# Patient Record
Sex: Female | Born: 1975
Health system: Southern US, Community
[De-identification: ages and names within clinical notes are randomized; demographics above are authoritative.]

## PROBLEM LIST (undated history)

## (undated) ENCOUNTER — Inpatient Hospital Stay: Payer: Self-pay

## (undated) DIAGNOSIS — Z973 Presence of spectacles and contact lenses: Secondary | ICD-10-CM

## (undated) DIAGNOSIS — H9190 Unspecified hearing loss, unspecified ear: Secondary | ICD-10-CM

## (undated) DIAGNOSIS — F419 Anxiety disorder, unspecified: Secondary | ICD-10-CM

## (undated) DIAGNOSIS — K219 Gastro-esophageal reflux disease without esophagitis: Secondary | ICD-10-CM

## (undated) DIAGNOSIS — E079 Disorder of thyroid, unspecified: Secondary | ICD-10-CM

## (undated) HISTORY — DX: Disorder of thyroid, unspecified: E07.9

## (undated) HISTORY — DX: Gastro-esophageal reflux disease without esophagitis: K21.9

## (undated) HISTORY — DX: Anxiety disorder, unspecified: F41.9

---

## 2013-06-18 ENCOUNTER — Emergency Department: Payer: Self-pay | Admitting: Emergency Medicine

## 2013-06-18 LAB — BASIC METABOLIC PANEL
Anion Gap: 9 (ref 7–16)
BUN: 13 mg/dL (ref 7–18)
CHLORIDE: 99 mmol/L (ref 98–107)
CREATININE: 0.72 mg/dL (ref 0.60–1.30)
Calcium, Total: 9.6 mg/dL (ref 8.5–10.1)
Co2: 27 mmol/L (ref 21–32)
EGFR (Non-African Amer.): >60
Glucose: 91 mg/dL (ref 65–99)
Osmolality: 270 (ref 275–301)
Potassium: 3.3 mmol/L — ABNORMAL LOW (ref 3.5–5.1)
Sodium: 135 mmol/L — ABNORMAL LOW (ref 136–145)

## 2013-06-18 LAB — CBC
HCT: 45.6 % (ref 35.0–47.0)
HGB: 15.3 g/dL (ref 12.0–16.0)
MCH: 32.5 pg (ref 26.0–34.0)
MCHC: 33.5 g/dL (ref 32.0–36.0)
MCV: 97 fL (ref 80–100)
PLATELETS: 237 10*3/uL (ref 150–440)
RBC: 4.71 10*6/uL (ref 3.80–5.20)
RDW: 12.6 % (ref 11.5–14.5)
WBC: 9 10*3/uL (ref 3.6–11.0)

## 2013-06-18 LAB — TROPONIN I: Troponin-I: <0.02 ng/mL

## 2013-06-19 LAB — TROPONIN I: Troponin-I: <0.02 ng/mL

## 2015-03-10 ENCOUNTER — Ambulatory Visit (INDEPENDENT_AMBULATORY_CARE_PROVIDER_SITE_OTHER): Payer: BLUE CROSS/BLUE SHIELD | Admitting: Family Medicine

## 2015-03-10 ENCOUNTER — Encounter: Payer: Self-pay | Admitting: Family Medicine

## 2015-03-10 VITALS — BP 116/78 | HR 69 | Temp 97.8°F | Resp 16 | Ht 65.0 in | Wt 169.6 lb

## 2015-03-10 DIAGNOSIS — K219 Gastro-esophageal reflux disease without esophagitis: Secondary | ICD-10-CM | POA: Diagnosis not present

## 2015-03-10 DIAGNOSIS — Z8249 Family history of ischemic heart disease and other diseases of the circulatory system: Secondary | ICD-10-CM | POA: Diagnosis not present

## 2015-03-10 DIAGNOSIS — R5382 Chronic fatigue, unspecified: Secondary | ICD-10-CM

## 2015-03-10 DIAGNOSIS — H9190 Unspecified hearing loss, unspecified ear: Secondary | ICD-10-CM | POA: Insufficient documentation

## 2015-03-10 DIAGNOSIS — Z7689 Persons encountering health services in other specified circumstances: Secondary | ICD-10-CM

## 2015-03-10 DIAGNOSIS — IMO0001 Reserved for inherently not codable concepts without codable children: Secondary | ICD-10-CM | POA: Insufficient documentation

## 2015-03-10 DIAGNOSIS — E041 Nontoxic single thyroid nodule: Secondary | ICD-10-CM

## 2015-03-10 DIAGNOSIS — Z7189 Other specified counseling: Secondary | ICD-10-CM

## 2015-03-10 DIAGNOSIS — L309 Dermatitis, unspecified: Secondary | ICD-10-CM | POA: Diagnosis not present

## 2015-03-10 DIAGNOSIS — H9193 Unspecified hearing loss, bilateral: Secondary | ICD-10-CM

## 2015-03-10 MED ORDER — TRIAMCINOLONE ACETONIDE 0.1 % EX CREA: 1.0000 "application " | TOPICAL_CREAM | Freq: Two times a day (BID) | CUTANEOUS | Status: DC

## 2015-03-10 NOTE — Assessment & Plan Note (Signed)
Hands and chest. Trial of kenalog cream BID to help with symptoms. If not improving- refer to dermatology.

## 2015-03-10 NOTE — Assessment & Plan Note (Signed)
Controlled. Alarm symptoms reviewed. Encouraged avoiding dietary triggers.

## 2015-03-10 NOTE — Patient Instructions (Signed)
We will get an ultrasound of you thyroid to check on the old nodule. We will also check some lab work.  Please get labwork done at [facility] and I will call you with the results.    You will need an appt for a well-woman exam with pap smear anytime this year.    You can try the Kenalog cream twice daily for the eczema (skin rash) on your hands and chest. If not getting better, we will have you see a dermatologist.

## 2015-03-10 NOTE — Assessment & Plan Note (Signed)
Wears hearing aids. Able to read lips. No communication problems. All phone communication via her husband.

## 2015-03-10 NOTE — Progress Notes (Signed)
Subjective:    Patient ID: Vicki Walters, female    DOB: 05-01-1975, 40 y.o.   MRN: [id number]  HPI: Vicki Walters is a 40 y.o. female presenting on 03/10/2015 for Establish Care   HPI  Pt presents to establish care today. Previous care provider was in [place].  It has been 4 years since Her last PCP visit. Records from previous provider will be requested and reviewed. Current medical problems include:  Hearing impaired: Reads lips and uses hearing aids.  Acid Reflux: Cntrolled. Takes Tums PRN. After certain foods. No dysphagia. No regurg.  Thyroid disease:  Diagnosed 10 years ago. Told it was not serious. Feels hot most of the time. Having issues with dry skin. Previous ultrasound of the neck- 2cm thyroid. Last ultrasoun.  Eczema: On hands and chest. Tried hydrocortisone with little relief. Worsening over past year.   Health maintenance:  Last pap 6 years ago.  Never had a mammogram Declines flu shot.  Caregiver for Home instead    Past Medical History  Diagnosis Date  . GERD (gastroesophageal reflux disease)   . Thyroid disease    Social History   Social History  . Marital Status: Single    Spouse Name: N/A  . Number of Children: N/A  . Years of Education: N/A   Occupational History  . Not on file.   Social History Main Topics  . Smoking status: Former Smoker -- 0.50 packs/day for 10 years    Quit date: 03/10/2007  . Smokeless tobacco: Never Used  . Alcohol Use: 3.0 oz/week    5 Glasses of wine per week  . Drug Use: No  . Sexual Activity: Not on file   Other Topics Concern  . Not on file   Social History Narrative  . No narrative on file   Family History  Problem Relation Age of Onset  . Heart disease Maternal Grandmother   . Cancer Maternal Grandfather   . Diabetes Paternal Grandfather    No current outpatient prescriptions on file prior to visit.   No current facility-administered medications on file prior to visit.    Review of Systems    Constitutional: Positive for fatigue and unexpected weight change. Negative for fever and chills.  HENT: Negative.  Negative for sore throat, trouble swallowing and voice change.        Sensation that neck is bigger- no trouble swallowing.   Respiratory: Negative for cough, chest tightness and wheezing.   Cardiovascular: Negative for chest pain and leg swelling.  Gastrointestinal: Negative for nausea, vomiting, abdominal pain, diarrhea and constipation.  Endocrine: Negative.  Negative for cold intolerance, heat intolerance, polydipsia, polyphagia and polyuria.  Genitourinary: Negative for dysuria and difficulty urinating.  Musculoskeletal: Negative.   Skin: Positive for rash.  Neurological: Negative for dizziness, light-headedness and numbness.  Psychiatric/Behavioral: Negative.    Per HPI unless specifically indicated above     Objective:    BP 116/78 mmHg  Pulse 69  Temp(Src) 97.8 F (36.6 C) (Oral)  Resp 16  Ht 5' 5"  (1.651 m)  Wt 169 lb 9.6 oz (76.93 kg)  BMI 28.22 kg/m2  Wt Readings from Last 3 Encounters:  03/10/15 169 lb 9.6 oz (76.93 kg)    Physical Exam  Constitutional: She is oriented to person, place, and time. She appears well-developed and well-nourished.  HENT:  Head: Normocephalic and atraumatic.  Right Ear: Hearing and tympanic membrane normal.  Left Ear: Hearing and tympanic membrane normal.  Nose: Nose normal.  Mouth/Throat: Uvula is  midline, oropharynx is clear and moist and mucous membranes are normal.  Neck: Normal range of motion. Neck supple. No thyromegaly present.  Cardiovascular: Normal rate, regular rhythm and normal heart sounds.  Exam reveals no gallop and no friction rub.   No murmur heard. Pulmonary/Chest: Effort normal and breath sounds normal. She has no wheezes. She exhibits no tenderness.  Abdominal: Soft. Normal appearance and bowel sounds are normal. She exhibits no distension and no mass. There is no tenderness. There is no rebound and  no guarding.  Musculoskeletal: Normal range of motion. She exhibits no edema or tenderness.  Lymphadenopathy:    She has no cervical adenopathy.  Neurological: She is alert and oriented to person, place, and time.  Skin: Skin is warm and dry.   Results for orders placed or performed in visit on 06/18/13  Troponin I  Result Value Ref Range   Troponin-I < 0.02 ng/mL  CBC  Result Value Ref Range   WBC 9.0 3.6-11.0 x10 3/mm 3   RBC 4.71 3.80-5.20 X10 6/mm 3   HGB 15.3 12.0-16.0 g/dL   HCT 45.6 35.0-47.0 %   MCV 97 80-100 fL   MCH 32.5 26.0-34.0 pg   MCHC 33.5 32.0-36.0 g/dL   RDW 12.6 11.5-14.5 %   Platelet 237 150-440 x10 3/mm 3  Basic metabolic panel  Result Value Ref Range   Glucose 91 65-99 mg/dL   BUN 13 7-18 mg/dL   Creatinine 0.72 0.60-1.30 mg/dL   Sodium 135 (L) 136-145 mmol/L   Potassium 3.3 (L) 3.5-5.1 mmol/L   Chloride 99 98-107 mmol/L   Co2 27 21-32 mmol/L   Calcium, Total 9.6 8.5-10.1 mg/dL   Osmolality 270 275-301   Anion Gap 9 7-16   EGFR (African American) >60    EGFR (Non-African Amer.) >60   Troponin I  Result Value Ref Range   Troponin-I < 0.02 ng/mL      Assessment & Plan:   Problem List Items Addressed This Visit      Digestive   GERD (gastroesophageal reflux disease)    Controlled. Alarm symptoms reviewed. Encouraged avoiding dietary triggers.       Relevant Orders   Comprehensive Metabolic Panel (CMET)     Endocrine   Thyroid nodule - Primary    Diagnosed 10 years ago with ultrasound. Has not had follow-up. Check TSH, T3, T4 today. Order [place]. Consider endocrine referral as needed.       Relevant Orders   T3, free   T4, free   TSH   US Soft Tissue Head/Neck     Nervous and Auditory   Hearing impaired    Wears hearing aids. Able to read lips. No communication problems. All phone communication via her husband.         Musculoskeletal and Integument   Eczema    Hands and chest. Trial of kenalog cream BID to help with symptoms. If  not improving- refer to dermatology.       Relevant Medications   triamcinolone cream (KENALOG) 0.1 %   Other Relevant Orders   CBC with Differential/Platelet    Other Visit Diagnoses    Family history of heart disease        Relevant Orders    Lipid Profile    Chronic fatigue        Check Vitamn D and TSH.     Relevant Orders    VITAMIN D 25 Hydroxy (Vit-D Deficiency, Fractures)    Encounter to establish care  Preventative health maintenance reviewed. Pt will make appt for Well Woman to update pap smear.        Meds ordered this encounter  Medications  . triamcinolone cream (KENALOG) 0.1 %    Sig: Apply 1 application topically 2 (two) times daily.    Dispense:  30 g    Refill:  1    Order Specific Question:  Supervising Provider    Answer:  Arlis Porta [[id number]]      Follow up plan: Return in about 6 months (around 09/07/2015).

## 2015-03-10 NOTE — Assessment & Plan Note (Signed)
Diagnosed 10 years ago with ultrasound. Has not had follow-up. Check TSH, T3, T4 today. Order [place]. Consider endocrine referral as needed.

## 2015-03-12 LAB — LIPID PANEL
Chol/HDL Ratio: 3.4 ratio units (ref 0.0–4.4)
Cholesterol, Total: 240 mg/dL — ABNORMAL HIGH (ref 100–199)
HDL: 70 mg/dL (ref >39)
LDL Calculated: 136 mg/dL — ABNORMAL HIGH (ref 0–99)
TRIGLYCERIDES: 169 mg/dL — AB (ref 0–149)
VLDL Cholesterol Cal: 34 mg/dL (ref 5–40)

## 2015-03-12 LAB — COMPREHENSIVE METABOLIC PANEL
A/G RATIO: 1.7 (ref 1.1–2.5)
ALT: 27 IU/L (ref 0–32)
AST: 23 IU/L (ref 0–40)
Albumin: 4.2 g/dL (ref 3.5–5.5)
Alkaline Phosphatase: 61 IU/L (ref 39–117)
BILIRUBIN TOTAL: 0.6 mg/dL (ref 0.0–1.2)
BUN/Creatinine Ratio: 18 (ref 8–20)
BUN: 12 mg/dL (ref 6–20)
CO2: 24 mmol/L (ref 18–29)
Calcium: 8.7 mg/dL (ref 8.7–10.2)
Chloride: 99 mmol/L (ref 96–106)
Creatinine, Ser: 0.68 mg/dL (ref 0.57–1.00)
GFR calc Af Amer: 128 mL/min/{1.73_m2} (ref >59)
GFR calc non Af Amer: 111 mL/min/{1.73_m2} (ref >59)
Globulin, Total: 2.5 g/dL (ref 1.5–4.5)
Glucose: 79 mg/dL (ref 65–99)
POTASSIUM: 3.9 mmol/L (ref 3.5–5.2)
SODIUM: 138 mmol/L (ref 134–144)
Total Protein: 6.7 g/dL (ref 6.0–8.5)

## 2015-03-12 LAB — CBC WITH DIFFERENTIAL/PLATELET
Basophils Absolute: 0 10*3/uL (ref 0.0–0.2)
Basos: 1 %
EOS (ABSOLUTE): 0.1 10*3/uL (ref 0.0–0.4)
Eos: 1 %
HEMATOCRIT: 43.3 % (ref 34.0–46.6)
HEMOGLOBIN: 14.5 g/dL (ref 11.1–15.9)
IMMATURE GRANS (ABS): 0 10*3/uL (ref 0.0–0.1)
IMMATURE GRANULOCYTES: 0 %
LYMPHS ABS: 2.8 10*3/uL (ref 0.7–3.1)
Lymphs: 47 %
MCH: 31.9 pg (ref 26.6–33.0)
MCHC: 33.5 g/dL (ref 31.5–35.7)
MCV: 95 fL (ref 79–97)
MONOS ABS: 0.5 10*3/uL (ref 0.1–0.9)
Monocytes: 8 %
NEUTROS ABS: 2.5 10*3/uL (ref 1.4–7.0)
Neutrophils: 43 %
Platelets: 238 10*3/uL (ref 150–379)
RBC: 4.54 x10E6/uL (ref 3.77–5.28)
RDW: 13.7 % (ref 12.3–15.4)
WBC: 5.9 10*3/uL (ref 3.4–10.8)

## 2015-03-12 LAB — VITAMIN D 25 HYDROXY (VIT D DEFICIENCY, FRACTURES): VIT D 25 HYDROXY: 26.2 ng/mL — AB (ref 30.0–100.0)

## 2015-03-12 LAB — T4, FREE: Free T4: 1.1 ng/dL (ref 0.82–1.77)

## 2015-03-12 LAB — TSH: TSH: 0.823 u[IU]/mL (ref 0.450–4.500)

## 2015-03-12 LAB — T3, FREE: T3, Free: 3 pg/mL (ref 2.0–4.4)

## 2015-03-13 ENCOUNTER — Encounter: Payer: Self-pay | Admitting: Family Medicine

## 2015-03-14 ENCOUNTER — Other Ambulatory Visit: Payer: Self-pay | Admitting: Family Medicine

## 2015-03-14 DIAGNOSIS — E559 Vitamin D deficiency, unspecified: Secondary | ICD-10-CM

## 2015-03-14 MED ORDER — VITAMIN D (ERGOCALCIFEROL) 1.25 MG (50000 UNIT) PO CAPS: 50000.0000 [IU] | ORAL_CAPSULE | ORAL | Status: DC

## 2015-03-16 ENCOUNTER — Ambulatory Visit
Admission: RE | Admit: 2015-03-16 | Discharge: 2015-03-16 | Disposition: A | Discharge status: Home / Self Care | Payer: BLUE CROSS/BLUE SHIELD | Source: Ambulatory Visit | Attending: Family Medicine | Admitting: Family Medicine

## 2015-03-16 DIAGNOSIS — R591 Generalized enlarged lymph nodes: Secondary | ICD-10-CM | POA: Insufficient documentation

## 2015-03-16 DIAGNOSIS — E042 Nontoxic multinodular goiter: Secondary | ICD-10-CM | POA: Diagnosis not present

## 2015-03-16 DIAGNOSIS — E041 Nontoxic single thyroid nodule: Secondary | ICD-10-CM

## 2015-03-22 NOTE — Telephone Encounter (Signed)
Patient had her husband call and inquire about thyroid US. Please call him with results @ [phone].

## 2015-04-17 ENCOUNTER — Encounter: Payer: Self-pay | Admitting: Family Medicine

## 2015-04-18 ENCOUNTER — Encounter: Payer: Self-pay | Admitting: Family Medicine

## 2015-04-28 ENCOUNTER — Other Ambulatory Visit: Payer: Self-pay | Admitting: Family Medicine

## 2015-04-28 DIAGNOSIS — E559 Vitamin D deficiency, unspecified: Secondary | ICD-10-CM

## 2015-04-28 MED ORDER — VITAMIN D (ERGOCALCIFEROL) 1.25 MG (50000 UNIT) PO CAPS: 50000.0000 [IU] | ORAL_CAPSULE | ORAL | Status: DC

## 2015-05-17 ENCOUNTER — Ambulatory Visit: Payer: Self-pay | Admitting: Family Medicine

## 2015-05-25 ENCOUNTER — Encounter: Payer: Self-pay | Admitting: Family Medicine

## 2015-05-31 ENCOUNTER — Ambulatory Visit: Payer: Self-pay | Admitting: Family Medicine

## 2015-05-31 ENCOUNTER — Encounter: Payer: Self-pay | Admitting: Family Medicine

## 2015-05-31 ENCOUNTER — Ambulatory Visit (INDEPENDENT_AMBULATORY_CARE_PROVIDER_SITE_OTHER): Payer: BLUE CROSS/BLUE SHIELD | Admitting: Family Medicine

## 2015-05-31 VITALS — BP 127/81 | HR 63 | Temp 97.8°F | Resp 16 | Ht 65.0 in | Wt 162.7 lb

## 2015-05-31 DIAGNOSIS — E559 Vitamin D deficiency, unspecified: Secondary | ICD-10-CM

## 2015-05-31 DIAGNOSIS — Z01419 Encounter for gynecological examination (general) (routine) without abnormal findings: Secondary | ICD-10-CM

## 2015-05-31 DIAGNOSIS — R51 Headache: Secondary | ICD-10-CM | POA: Diagnosis not present

## 2015-05-31 DIAGNOSIS — Z1239 Encounter for other screening for malignant neoplasm of breast: Secondary | ICD-10-CM | POA: Diagnosis not present

## 2015-05-31 DIAGNOSIS — R519 Headache, unspecified: Secondary | ICD-10-CM

## 2015-05-31 DIAGNOSIS — Z124 Encounter for screening for malignant neoplasm of cervix: Secondary | ICD-10-CM | POA: Diagnosis not present

## 2015-05-31 DIAGNOSIS — L309 Dermatitis, unspecified: Secondary | ICD-10-CM | POA: Diagnosis not present

## 2015-05-31 NOTE — Patient Instructions (Addendum)
Preparing for Pregnancy Before trying to become pregnant, make an appointment with your health care provider (preconception care). The goal is to help you have a healthy, safe pregnancy. At your first appointment, your health care provider will:   Do a complete physical exam, including a Pap test.  Take a complete medical history.  Give you advice and help you resolve any problems. PRECONCEPTION CHECKLIST Here is a list of the basics to cover with your health care provider at your preconception visit:  Medical history.  Tell your health care provider about any diseases you have had. Many diseases can affect your pregnancy.  Include your partner's medical history and family history.  Make sure you have been tested for sexually transmitted infections (STIs). These can affect your pregnancy. In some cases, they can be passed to your baby. Tell your health care provider about any history of STIs.  Make sure your health care provider knows about any previous problems you have had with conception or pregnancy.  Tell your health care provider about any medicine you take. This includes herbal supplements and over-the-counter medicines.  Make sure all your immunizations are up to date. You may need to make additional appointments.  Ask your health care provider if you need any vaccinations or if there are any you should avoid.  Diet.  It is especially important to eat a healthy, balanced diet with the right nutrients when you are pregnant.  Ask your health care provider to help you get to a healthy weight before pregnancy.  If you are overweight, you are at higher risk for certain complications. These include high blood pressure, diabetes, and preterm birth.  If you are underweight, you are more likely to have a low-birth-weight baby.  Lifestyle.  Tell your health care provider about lifestyle factors such as alcohol use, drug use, or smoking.  Describe any harmful substances you may  be exposed to at work or home. These can include chemicals, pesticides, and radiation.  Mental health.  Let your health care provider know if you have been feeling depressed or anxious.  Let your health care provider know if you have a history of substance abuse.  Let your health care provider know if you do not feel safe at home. HOME INSTRUCTIONS TO PREPARE FOR PREGNANCY Follow your health care provider's advice and instructions.   Keep an accurate record of your menstrual periods. This makes it easier for your health care provider to determine your baby's due date.  Begin taking prenatal vitamins and folic acid supplements daily. Take them as directed by your health care provider.  Eat a balanced diet. Get help from a nutrition counselor if you have questions or need help.  Get regular exercise. Try to be active for at least 30 minutes a day most days of the week.  Quit smoking, if you smoke.  Do not drink alcohol.  Do not take illegal drugs.  Get medical problems, such as diabetes or high blood pressure, under control.  If you have diabetes, make sure you do the following:  Have good blood sugar control. If you have type 1 diabetes, use multiple daily doses of insulin. Do not use split-dose or premixed insulin.  Have an eye exam by a qualified eye care professional trained in caring for people with diabetes.  Get evaluated by your health care provider for cardiovascular disease.  Get to a healthy weight. If you are overweight or obese, reduce your weight with the help of a qualified health   professional such as a Firefighter. Ask your health care provider what the right weight range is for you. HOW DO I KNOW I AM PREGNANT? You may be pregnant if you have been sexually active and you miss your period. Symptoms of early pregnancy include:   Mild cramping.  Very light vaginal bleeding (spotting).  Feeling unusually tired.  Morning sickness. If you have any of  these symptoms, take a home pregnancy test. These tests look for a hormone called human chorionic gonadotropin (hCG) in your urine. Your body begins to make this hormone during early pregnancy. These tests are very accurate. Wait until at least the first day you miss your period to take one. If you get a positive result, call your health care provider to make appointments for prenatal care. WHAT SHOULD I DO IF I BECOME PREGNANT?  Make an appointment with your health care provider by week 12 of your pregnancy at the latest.  Do not smoke. Smoking can be harmful to your baby.  Do not drink alcoholic beverages. Alcohol is related to a number of birth defects.  Avoid toxic odors and chemicals.  You may continue to have sexual intercourse if it does not cause pain or other problems, such as vaginal bleeding.   This information is not intended to replace advice given to you by your health care provider. Make sure you discuss any questions you have with your health care provider.   Document Released: 12/22/2007 Document Revised: 01/29/2014 Document Reviewed: 12/15/2012 Elsevier Interactive Patient Education [employer].

## 2015-05-31 NOTE — Addendum Note (Signed)
Addended byVincenza Hews, Kazaria Gaertner L on: 05/31/2015 04:07 PM   Modules accepted: Orders, SmartSet

## 2015-05-31 NOTE — Addendum Note (Signed)
Addended by: Frederich Cha D on: 05/31/2015 03:12 PM   Modules accepted: Miquel Dunn

## 2015-05-31 NOTE — Progress Notes (Addendum)
Subjective:    Patient ID: Vicki Walters, female    DOB: Sep 29, 1975, 40 y.o.   MRN: [id number]  HPI: Vicki Walters is a 39 y.o. female presenting on 05/31/2015 for Gynecologic Exam   HPI  Pt presents for well woman exam. No pelvic pain or changes in breast. Has been 10 years ago. Normal pap smear. Does not check breasts at home. Twin sister was diagnosed with breast cancer- thinks it was related to birth control. Wants to get checked. No current birth control method. Thinking about pregnancy. Not taking prenatal vitamin. Past pregnancy- 1 elective abortion at age 67. Menstrual cycles are regular. LMP: 05/28/2015. Typically cycles 2-3 days. Heavy at first and then gets lighter.   Also reporting HA's- tingling in her head. Symptoms present x a few months. Tingling on L side of head. Some frontal head pain "feels like bubbles" lasts about 5 seconds goes away. Occurred most recently last night. Pattern- occurs later in the day. Last week it occurred all day long. No trauma to the head. No visual changes with symptoms. Nothing relieves symptoms worse.  Also reporting sore spot on the head. Hurting in the head. Took advil- helped a little bit. Helps for a little.  Pt also requesting referral to dermatology for her eczema.   Past Medical History  Diagnosis Date  . GERD (gastroesophageal reflux disease)   . Thyroid disease     Current Outpatient Prescriptions on File Prior to Visit  Medication Sig  . triamcinolone cream (KENALOG) 0.1 % Apply 1 application topically 2 (two) times daily.  . Vitamin D, Ergocalciferol, (DRISDOL) 50000 units CAPS capsule Take 1 capsule (50,000 Units total) by mouth every 7 (seven) days.   No current facility-administered medications on file prior to visit.    Review of Systems  Constitutional: Negative for fever and chills.  Respiratory: Negative for cough, chest tightness and wheezing.   Cardiovascular: Negative for chest pain and leg swelling.    Gastrointestinal: Negative for nausea, vomiting, abdominal pain, diarrhea and constipation.  Endocrine: Negative.  Negative for cold intolerance, heat intolerance, polydipsia, polyphagia and polyuria.  Genitourinary: Negative for dysuria, urgency, flank pain, vaginal bleeding, vaginal discharge, difficulty urinating, vaginal pain, menstrual problem and pelvic pain.  Musculoskeletal: Negative.   Skin: Positive for rash (eczema).  Neurological: Positive for headaches. Negative for dizziness, light-headedness and numbness.  Psychiatric/Behavioral: Negative.    Per HPI unless specifically indicated above     Objective:    BP 127/81 mmHg  Pulse 63  Temp(Src) 97.8 F (36.6 C) (Oral)  Resp 16  Ht 5\' 5"  (1.651 m)  Wt 162 lb 11.2 oz (73.8 kg)  BMI 27.07 kg/m2  LMP 05/29/2015  Wt Readings from Last 3 Encounters:  05/31/15 162 lb 11.2 oz (73.8 kg)  03/10/15 169 lb 9.6 oz (76.93 kg)    Physical Exam  Constitutional: She is oriented to person, place, and time. She appears well-developed and well-nourished.  HENT:  Head: Normocephalic and atraumatic.  Neck: Neck supple.  Cardiovascular: Normal rate, regular rhythm and normal heart sounds.  Exam reveals no gallop and no friction rub.   No murmur heard. Pulmonary/Chest: Effort normal and breath sounds normal. She has no wheezes. She exhibits no tenderness.  Abdominal: Soft. Normal appearance and bowel sounds are normal. She exhibits no distension and no mass. There is no tenderness. There is no rebound and no guarding.  Genitourinary: No breast swelling, tenderness, discharge or bleeding. There is no rash, tenderness, lesion or injury on the  right labia. There is no rash, tenderness, lesion or injury on the left labia. Uterus is not deviated, not enlarged, not fixed and not tender. Cervix exhibits discharge (small amount dark red blood from previous period. ). Cervix exhibits no motion tenderness and no friability. No erythema in the vagina. No  signs of injury around the vagina. No vaginal discharge found.  Musculoskeletal: Normal range of motion. She exhibits no edema or tenderness.  Lymphadenopathy:    She has no cervical adenopathy.  Neurological: She is alert and oriented to person, place, and time.  Skin: Skin is warm and dry.  Red scaling lesions on chest and hands.    Results for orders placed or performed in visit on 03/10/15  Comprehensive Metabolic Panel (CMET)  Result Value Ref Range   Glucose 79 65 - 99 mg/dL   BUN 12 6 - 20 mg/dL   Creatinine, Ser 0.68 0.57 - 1.00 mg/dL   GFR calc non Af Amer 111 >59 mL/min/1.73   GFR calc Af Amer 128 >59 mL/min/1.73   BUN/Creatinine Ratio 18 8 - 20   Sodium 138 134 - 144 mmol/L   Potassium 3.9 3.5 - 5.2 mmol/L   Chloride 99 96 - 106 mmol/L   CO2 24 18 - 29 mmol/L   Calcium 8.7 8.7 - 10.2 mg/dL   Total Protein 6.7 6.0 - 8.5 g/dL   Albumin 4.2 3.5 - 5.5 g/dL   Globulin, Total 2.5 1.5 - 4.5 g/dL   Albumin/Globulin Ratio 1.7 1.1 - 2.5   Bilirubin Total 0.6 0.0 - 1.2 mg/dL   Alkaline Phosphatase 61 39 - 117 IU/L   AST 23 0 - 40 IU/L   ALT 27 0 - 32 IU/L  Lipid Profile  Result Value Ref Range   Cholesterol, Total 240 (H) 100 - 199 mg/dL   Triglycerides 169 (H) 0 - 149 mg/dL   HDL 70 >39 mg/dL   VLDL Cholesterol Cal 34 5 - 40 mg/dL   LDL Calculated 136 (H) 0 - 99 mg/dL   Chol/HDL Ratio 3.4 0.0 - 4.4 ratio units  T3, free  Result Value Ref Range   T3, Free 3.0 2.0 - 4.4 pg/mL  T4, free  Result Value Ref Range   Free T4 1.10 0.82 - 1.77 ng/dL  TSH  Result Value Ref Range   TSH 0.823 0.450 - 4.500 uIU/mL  VITAMIN D 25 Hydroxy (Vit-D Deficiency, Fractures)  Result Value Ref Range   Vit D, 25-Hydroxy 26.2 (L) 30.0 - 100.0 ng/mL  CBC with Differential/Platelet  Result Value Ref Range   WBC 5.9 3.4 - 10.8 x10E3/uL   RBC 4.54 3.77 - 5.28 x10E6/uL   Hemoglobin 14.5 11.1 - 15.9 g/dL   Hematocrit 43.3 34.0 - 46.6 %   MCV 95 79 - 97 fL   MCH 31.9 26.6 - 33.0 pg   MCHC  33.5 31.5 - 35.7 g/dL   RDW 13.7 12.3 - 15.4 %   Platelets 238 150 - 379 x10E3/uL   Neutrophils 43 %   Lymphs 47 %   Monocytes 8 %   Eos 1 %   Basos 1 %   Neutrophils Absolute 2.5 1.4 - 7.0 x10E3/uL   Lymphocytes Absolute 2.8 0.7 - 3.1 x10E3/uL   Monocytes Absolute 0.5 0.1 - 0.9 x10E3/uL   EOS (ABSOLUTE) 0.1 0.0 - 0.4 x10E3/uL   Basophils Absolute 0.0 0.0 - 0.2 x10E3/uL   Immature Granulocytes 0 %   Immature Grans (Abs) 0.0 0.0 - 0.1 x10E3/uL  Assessment & Plan:   Problem List Items Addressed This Visit      Other   Vitamin D deficiency   Relevant Orders   VITAMIN D 25 Hydroxy (Vit-D Deficiency, Fractures)    Other Visit Diagnoses    Well woman exam with routine gynecological exam    -  Primary    Health mainteance reviewed. Pap done. Mammo ordered. Recheck 1 year.     Relevant Orders    Pap IG and HPV (high risk) DNA detection    Screening for breast cancer        Given pt sister's recent diagnosis of breast cancer- obtain mammogram.     Relevant Orders    MM Digital Screening    Screening for cervical cancer        Relevant Orders    Pap IG and HPV (high risk) DNA detection    Chronic daily headache        Sinus vs. eye strain. Recent eye exam normal. Glasses changed. Trial of daily claritin. Check CBC.     Relevant Orders    CBC with Differential/Platelet       No orders of the defined types were placed in this encounter.      Follow up plan: Return in about 1 year (around 05/30/2016), or if symptoms worsen or fail to improve.

## 2015-06-02 LAB — CBC WITH DIFFERENTIAL/PLATELET
BASOS: 1 %
Basophils Absolute: 0 10*3/uL (ref 0.0–0.2)
EOS (ABSOLUTE): 0 10*3/uL (ref 0.0–0.4)
EOS: 1 %
HEMATOCRIT: 42.7 % (ref 34.0–46.6)
Hemoglobin: 14.6 g/dL (ref 11.1–15.9)
IMMATURE GRANS (ABS): 0 10*3/uL (ref 0.0–0.1)
IMMATURE GRANULOCYTES: 0 %
LYMPHS: 42 %
Lymphocytes Absolute: 1.6 10*3/uL (ref 0.7–3.1)
MCH: 32.7 pg (ref 26.6–33.0)
MCHC: 34.2 g/dL (ref 31.5–35.7)
MCV: 96 fL (ref 79–97)
MONOCYTES: 8 %
Monocytes Absolute: 0.3 10*3/uL (ref 0.1–0.9)
NEUTROS PCT: 48 %
Neutrophils Absolute: 1.9 10*3/uL (ref 1.4–7.0)
Platelets: 232 10*3/uL (ref 150–379)
RBC: 4.46 x10E6/uL (ref 3.77–5.28)
RDW: 13.7 % (ref 12.3–15.4)
WBC: 3.8 10*3/uL (ref 3.4–10.8)

## 2015-06-02 LAB — PAP IG AND HPV HIGH-RISK
HPV, HIGH-RISK: NEGATIVE
PAP Smear Comment: 0

## 2015-06-02 LAB — VITAMIN D 25 HYDROXY (VIT D DEFICIENCY, FRACTURES): Vit D, 25-Hydroxy: 52.2 ng/mL (ref 30.0–100.0)

## 2015-06-22 ENCOUNTER — Encounter: Payer: Self-pay | Admitting: Family Medicine

## 2015-06-23 ENCOUNTER — Ambulatory Visit
Admission: RE | Admit: 2015-06-23 | Discharge: 2015-06-23 | Disposition: A | Discharge status: Home / Self Care | Payer: BLUE CROSS/BLUE SHIELD | Source: Ambulatory Visit | Attending: Family Medicine | Admitting: Family Medicine

## 2015-06-23 ENCOUNTER — Other Ambulatory Visit: Payer: Self-pay | Admitting: Family Medicine

## 2015-06-23 DIAGNOSIS — Z1239 Encounter for other screening for malignant neoplasm of breast: Secondary | ICD-10-CM

## 2015-06-23 DIAGNOSIS — Z1231 Encounter for screening mammogram for malignant neoplasm of breast: Secondary | ICD-10-CM

## 2015-07-02 ENCOUNTER — Encounter: Payer: Self-pay | Admitting: Family Medicine

## 2015-07-29 ENCOUNTER — Encounter: Payer: Self-pay | Admitting: Family Medicine

## 2015-08-11 ENCOUNTER — Other Ambulatory Visit: Payer: Self-pay | Admitting: Family Medicine

## 2015-08-18 ENCOUNTER — Encounter: Payer: Self-pay | Admitting: Family Medicine

## 2015-08-19 ENCOUNTER — Other Ambulatory Visit: Payer: Self-pay | Admitting: Family Medicine

## 2015-08-31 ENCOUNTER — Encounter: Payer: Self-pay | Admitting: Family Medicine

## 2015-09-02 ENCOUNTER — Encounter: Payer: Self-pay | Admitting: Family Medicine

## 2015-10-07 LAB — OB RESULTS CONSOLE GC/CHLAMYDIA
CHLAMYDIA, DNA PROBE: NEGATIVE
GC PROBE AMP, GENITAL: NEGATIVE

## 2015-10-07 LAB — OB RESULTS CONSOLE RUBELLA ANTIBODY, IGM: RUBELLA: IMMUNE

## 2015-10-07 LAB — OB RESULTS CONSOLE HIV ANTIBODY (ROUTINE TESTING): HIV: NONREACTIVE

## 2015-10-07 LAB — OB RESULTS CONSOLE VARICELLA ZOSTER ANTIBODY, IGG: Varicella: IMMUNE

## 2015-10-07 LAB — OB RESULTS CONSOLE HEPATITIS B SURFACE ANTIGEN: HEP B S AG: NEGATIVE

## 2015-10-10 DIAGNOSIS — E0590 Thyrotoxicosis, unspecified without thyrotoxic crisis or storm: Secondary | ICD-10-CM | POA: Insufficient documentation

## 2016-01-12 ENCOUNTER — Emergency Department
Admission: EM | Admit: 2016-01-12 | Discharge: 2016-01-12 | Disposition: A | Discharge status: Home / Self Care | Payer: BLUE CROSS/BLUE SHIELD | Attending: Emergency Medicine | Admitting: Emergency Medicine

## 2016-01-12 DIAGNOSIS — Z048 Encounter for examination and observation for other specified reasons: Secondary | ICD-10-CM | POA: Insufficient documentation

## 2016-01-12 DIAGNOSIS — Z87891 Personal history of nicotine dependence: Secondary | ICD-10-CM | POA: Insufficient documentation

## 2016-01-12 DIAGNOSIS — W010XXA Fall on same level from slipping, tripping and stumbling without subsequent striking against object, initial encounter: Secondary | ICD-10-CM | POA: Diagnosis not present

## 2016-01-12 DIAGNOSIS — W19XXXA Unspecified fall, initial encounter: Secondary | ICD-10-CM

## 2016-01-12 DIAGNOSIS — Z3A25 25 weeks gestation of pregnancy: Secondary | ICD-10-CM | POA: Insufficient documentation

## 2016-01-12 DIAGNOSIS — O26892 Other specified pregnancy related conditions, second trimester: Secondary | ICD-10-CM | POA: Diagnosis not present

## 2016-01-12 DIAGNOSIS — Y999 Unspecified external cause status: Secondary | ICD-10-CM | POA: Insufficient documentation

## 2016-01-12 DIAGNOSIS — Y9259 Other trade areas as the place of occurrence of the external cause: Secondary | ICD-10-CM | POA: Diagnosis not present

## 2016-01-12 DIAGNOSIS — Y939 Activity, unspecified: Secondary | ICD-10-CM | POA: Diagnosis not present

## 2016-01-12 NOTE — ED Notes (Signed)
ED Provider at bedside. 

## 2016-01-12 NOTE — ED Notes (Signed)
Patient fell yesterday slipped on water in garage. Fell on left hip/buttock and left knee. No visible bruising to hip, no pain with palpation.Denies hitting abdomen. Denies abdomen pain at this time. Did have sharp pains right after it happened.

## 2016-01-12 NOTE — ED Provider Notes (Signed)
[facility] Emergency Department Provider Note   ____________________________________________    I have reviewed the triage vital signs and the nursing notes.   HISTORY  Chief Complaint Fall     HPI Vicki Walters is a 40 y.o. female who reports she is [redacted] weeks pregnant with her first pregnancy and notes that she fell yesterday evening. patient reports she lost her balance and fell onto her side. No vaginal bleeding. No vaginal discharge. No abdominal pain. Patient feels well today, she is feeling baby kick. She does want to get checked out because she is worried   Past Medical History:  Diagnosis Date  . GERD (gastroesophageal reflux disease)   . Thyroid disease     Patient Active Problem List   Diagnosis Date Noted  . Vitamin D deficiency 05/31/2015  . Thyroid nodule 03/10/2015  . GERD (gastroesophageal reflux disease) 03/10/2015  . Eczema 03/10/2015  . Hearing impaired 03/10/2015    History reviewed. No pertinent surgical history.  Prior to Admission medications   Not on File     Allergies Patient has no known allergies.  Family History  Problem Relation Age of Onset  . Heart disease Maternal Grandmother   . Cancer Maternal Grandfather   . Diabetes Paternal Grandfather   . Breast cancer Sister 31    Social History Social History  Substance Use Topics  . Smoking status: Former Smoker    Packs/day: 0.50    Years: 10.00    Quit date: 03/10/2007  . Smokeless tobacco: Never Used  . Alcohol use 3.0 oz/week    5 Glasses of wine per week    Review of Systems  Constitutional: No Dizziness Eyes: No visual changes.  ENT: No neck pain Cardiovascular: Denies chest pain. Respiratory: Denies shortness of breath. Gastrointestinal: No abdominal pain.  No nausea, no vomiting.   Genitourinary: Negative for vaginal bleeding Musculoskeletal: Negative for back pain. Skin: Negative for rash. Neurological: Negative for headaches or  weakness  10-point ROS otherwise negative.  ____________________________________________   PHYSICAL EXAM:  VITAL SIGNS: ED Triage Vitals  Enc Vitals Group     BP 01/12/16 0921 121/70     Pulse Rate 01/12/16 0921 88     Resp 01/12/16 0921 18     Temp 01/12/16 0921 97.8 F (36.6 C)     Temp Source 01/12/16 0921 Oral     SpO2 01/12/16 0921 95 %     Weight 01/12/16 0913 182 lb (82.6 kg)     Height 01/12/16 0913 5\' 5"  (1.651 m)     Head Circumference --      Peak Flow --      Pain Score --      Pain Loc --      Pain Edu? --      Excl. in [place]? --     Constitutional: Alert and oriented. No acute distress. Pleasant and interactive Eyes: Conjunctivae are normal.   Nose: No congestion/rhinnorhea. Mouth/Throat: Mucous membranes are moist.   Neck:  Painless ROM Cardiovascular: Normal rate, regular rhythm. Kermit Balo peripheral circulation. Respiratory: Normal respiratory effort.  No retractions. Gastrointestinal: Gravid appearance, Soft and nontender. No distention.  No CVA tenderness. Genitourinary: deferred Musculoskeletal: Warm and well perfused Neurologic:  Normal speech and language. No gross focal neurologic deficits are appreciated.  Skin:  Skin is warm, dry and intact. No rash noted. Psychiatric: Mood and affect are normal. Speech and behavior are normal.  ____________________________________________   LABS (all labs ordered are  listed, but only abnormal results are displayed)  Labs Reviewed - No data to display ____________________________________________  EKG  None ____________________________________________  RADIOLOGY  EMBU: + fetal movement, HR 143, mother and father quite reassured.  ____________________________________________   PROCEDURES  Procedure(s) performed: No    Critical Care performed: No ____________________________________________   INITIAL IMPRESSION / ASSESSMENT AND PLAN / ED COURSE  Pertinent labs & imaging results that were  available during my care of the patient were reviewed by me and considered in my medical decision making (see chart for details).  Patient well-appearing and asymptomatic. Minor fall yesterday. Ultrasound is reassuring. No vaginal bleeding or abdominal pain. Outpatient follow-up as necessary. Patient agrees with plan  Clinical Course    ____________________________________________   FINAL CLINICAL IMPRESSION(S) / ED DIAGNOSES  Final diagnoses:  Fall, initial encounter      NEW MEDICATIONS STARTED DURING THIS VISIT:  There are no discharge medications for this patient.    Note:  This document was prepared using Dragon voice recognition software and may include unintentional dictation errors.    Lavonia Drafts, MD 01/12/16 [phone]

## 2016-01-12 NOTE — ED Triage Notes (Signed)
Pt states she tripped and fell onto her left side yesterday and some cramping for the first few minutes but none since, denies any bleeding or leaking fluid.. Pt is [redacted] weeks pregnant and is hearing impaired.

## 2016-01-23 NOTE — L&D Delivery Note (Signed)
Delivery Note At 6:15 PM a viable female was delivered via Vaginal, Spontaneous Delivery OA position.  APGAR: 8, 9; Vtx delivered, ant shoulder and post shoulder and body del at 1815. Placenta status:Del Intact with lt mec stained..  Cord: NO CAN. Baby cried and to mom's abd for skin to skin after wiping off.  FF and lochia mod with 2 clots expressed. Pitocin IV drip continuosly.  VSS. Hemostasis achieved.   Anesthesia:  Epidural for repair Episiotomy: None Lacerations: 2nd degree Suture Repair:3-0 CH on CT  Est. Blood Loss (mL):  400 mls Needle (3) and sponge count correct at 20.  Mom to postpartum.  Baby to Couplet care / Skin to Skin.  Catheryn Bacon 04/23/2016, 6:50 PM

## 2016-03-22 LAB — OB RESULTS CONSOLE GC/CHLAMYDIA
Chlamydia: NEGATIVE
GC PROBE AMP, GENITAL: NEGATIVE

## 2016-03-22 LAB — OB RESULTS CONSOLE RPR: RPR: NONREACTIVE

## 2016-03-22 LAB — OB RESULTS CONSOLE GBS: STREP GROUP B AG: NEGATIVE

## 2016-04-22 ENCOUNTER — Observation Stay
Admission: EM | Admit: 2016-04-22 | Discharge: 2016-04-22 | Disposition: A | Discharge status: Home / Self Care | Payer: BLUE CROSS/BLUE SHIELD | Source: Home / Self Care | Admitting: Obstetrics and Gynecology

## 2016-04-22 DIAGNOSIS — O471 False labor at or after 37 completed weeks of gestation: Secondary | ICD-10-CM | POA: Insufficient documentation

## 2016-04-22 DIAGNOSIS — Z3490 Encounter for supervision of normal pregnancy, unspecified, unspecified trimester: Secondary | ICD-10-CM

## 2016-04-22 DIAGNOSIS — Z3A40 40 weeks gestation of pregnancy: Secondary | ICD-10-CM

## 2016-04-22 DIAGNOSIS — Z87891 Personal history of nicotine dependence: Secondary | ICD-10-CM | POA: Insufficient documentation

## 2016-04-22 MED ORDER — OXYCODONE-ACETAMINOPHEN 7.5-325 MG PO TABS
1.0000 | ORAL_TABLET | Freq: Once | ORAL | Status: AC
  Administered 2016-04-22: 1 via ORAL
  Filled 2016-04-22: qty 1

## 2016-04-22 MED ORDER — OXYCODONE-ACETAMINOPHEN 5-325 MG PO TABS: 1.0000 | ORAL_TABLET | ORAL | Status: DC | PRN

## 2016-04-22 NOTE — Progress Notes (Signed)
Patient ID: Vicki Walters, female   DOB: 1975/12/02, 41 y.o.   MRN: [id number]  Vicki Walters is a 41 y.o. female. She is at [redacted]w[redacted]d gestation. Patient's last menstrual period was 06/20/2015. c/o ctx  Estimated Date of Delivery: 04/20/16  Prenatal care site: [facility] Chief Complaint: CTX  S:, no VB.no LOF,  Active fetal movement.   Menstrual like starting early in am   Maternal Medical History:   Past Medical History:  Diagnosis Date  . GERD (gastroesophageal reflux disease)   . Thyroid disease     History reviewed. No pertinent surgical history.  No Known Allergies  Prior to Admission medications   Medication Sig Start Date End Date Taking? Authorizing Provider  Prenatal Vit-Fe Fumarate-FA (PRENATAL MULTIVITAMIN) TABS tablet Take 1 tablet by mouth daily at 12 noon.   Yes Historical Provider, MD     Social History: She  reports that she quit smoking about 9 years ago. She has a 5.00 pack-year smoking history. She has never used smokeless tobacco. She reports that she drinks about 3.0 oz of alcohol per week . She reports that she does not use drugs.  Family History: family history includes Breast cancer (age of onset: 42) in her sister; Cancer in her maternal grandfather; Diabetes in her paternal grandfather; Heart disease in her maternal grandmother.  no history of gyn cancers  Review of Systems: A full review of systems was performed and negative except as noted in the HPI.     O:  BP 113/78   Pulse 85   Temp 98.3 F (36.8 C) (Oral)   LMP 06/20/2015  No results found for this or any previous visit (from the past 48 hour(s)).   Constitutional: NAD, AAOx3  HE/ENT: extraocular movements grossly intact, moist mucous membranes CV: RRR PULM: nl respiratory effort, CTABL     Abd: gravid, non-tender, non-distended, soft      Ext: Non-tender, Nonedmeatous   Psych: mood appropriate, speech normal Pelvic:checked by nursing x3 with exam of TFT / 90 / -2 vtx No  cervical change  SHF:WYOVZCH 1 minute decel on admission now 5 hours later no repeat Reactive  Good variability  CTX q 3 min   A/P:  Latent labor , no cervical change  Initial decel now with prolonged monitoring reassuring .   Labor: not present.   Percocet 7.5 / 325 mg po one before leaving hospital   RTC if ctx become stronger, LOF , vag bleeding , decreased fetal movt   ----- Vicki Baltimore, MD Attending Obstetrician and Gynecologist [facility], Department of [facility]

## 2016-04-22 NOTE — Discharge Summary (Signed)
Boykin Nearing, MD  Obstetrics    [] Hide copied text [] Hover for attribution information Patient ID: Vicki Walters, female   DOB: September 02, 1975, 41 y.o.   MRN: [id number]  Vicki Walters is a 41 y.o. female. She is at [redacted]w[redacted]d gestation. Patient's last menstrual period was 06/20/2015. c/o ctx  Estimated Date of Delivery: 04/20/16  Prenatal care site: [facility] Chief Complaint: CTX  S:, no VB.no LOF,  Active fetal movement.   Menstrual like starting early in am   Maternal Medical History:       Past Medical History:  Diagnosis Date  . GERD (gastroesophageal reflux disease)   . Thyroid disease     History reviewed. No pertinent surgical history.  No Known Allergies         Prior to Admission medications   Medication Sig Start Date End Date Taking? Authorizing Provider  Prenatal Vit-Fe Fumarate-FA (PRENATAL MULTIVITAMIN) TABS tablet Take 1 tablet by mouth daily at 12 noon.   Yes Historical Provider, MD     Social History: She  reports that she quit smoking about 9 years ago. She has a 5.00 pack-year smoking history. She has never used smokeless tobacco. She reports that she drinks about 3.0 oz of alcohol per week . She reports that she does not use drugs.  Family History: family history includes Breast cancer (age of onset: 67) in her sister; Cancer in her maternal grandfather; Diabetes in her paternal grandfather; Heart disease in her maternal grandmother.  no history of gyn cancers  Review of Systems: A full review of systems was performed and negative except as noted in the HPI.     O:  BP 113/78   Pulse 85   Temp 98.3 F (36.8 C) (Oral)   LMP 06/20/2015  No results found for this or any previous visit (from the past 48 hour(s)).   Constitutional: NAD, AAOx3  HE/ENT: extraocular movements grossly intact, moist mucous membranes CV: RRR PULM: nl respiratory effort, CTABL                                         Abd: gravid,  non-tender, non-distended, soft                                                  Ext: Non-tender, Nonedmeatous                     Psych: mood appropriate, speech normal Pelvic:checked by nursing x3 with exam of TFT / 90 / -2 vtx No cervical change  KGM:WNUUVOZ 1 minute decel on admission now 5 hours later no repeat Reactive  Good variability  CTX q 3 min   A/P:  Latent labor , no cervical change  Initial decel now with prolonged monitoring reassuring .   Labor: not present.   Percocet 7.5 / 325 mg po one before leaving hospital   RTC if ctx become stronger, LOF , vag bleeding , decreased fetal movt   ----- Laverta Baltimore, MD Attending Obstetrician and Gynecologist [facility], Department of [facility]     Electronically signed by Boykin Nearing, MD at 04/22/2016 1:20 PM      ED to Hosp-Admission (Current) on 04/22/2016

## 2016-04-22 NOTE — Progress Notes (Signed)
Patient discharged to home after one dose of percocet, patient and husband verbalized understanding of discharge instruciotns and when to return to the hospital.

## 2016-04-22 NOTE — OB Triage Note (Signed)
Patient reports to triage complaining of abdominal cramping that started around 4 am, she states she has has a small amount of bleeding and had been "leaking fluid" started wearing a pad at 4 am

## 2016-04-23 ENCOUNTER — Inpatient Hospital Stay: Payer: BLUE CROSS/BLUE SHIELD | Admitting: Anesthesiology

## 2016-04-23 ENCOUNTER — Inpatient Hospital Stay
Admission: EM | Admit: 2016-04-23 | Discharge: 2016-04-25 | DRG: 775 | Disposition: A | Discharge status: Home / Self Care | Payer: BLUE CROSS/BLUE SHIELD | Attending: Obstetrics and Gynecology | Admitting: Obstetrics and Gynecology

## 2016-04-23 DIAGNOSIS — Z8249 Family history of ischemic heart disease and other diseases of the circulatory system: Secondary | ICD-10-CM | POA: Diagnosis not present

## 2016-04-23 DIAGNOSIS — H9190 Unspecified hearing loss, unspecified ear: Secondary | ICD-10-CM | POA: Diagnosis present

## 2016-04-23 DIAGNOSIS — Z833 Family history of diabetes mellitus: Secondary | ICD-10-CM

## 2016-04-23 DIAGNOSIS — Z87891 Personal history of nicotine dependence: Secondary | ICD-10-CM | POA: Diagnosis not present

## 2016-04-23 DIAGNOSIS — E0590 Thyrotoxicosis, unspecified without thyrotoxic crisis or storm: Secondary | ICD-10-CM | POA: Diagnosis present

## 2016-04-23 DIAGNOSIS — D509 Iron deficiency anemia, unspecified: Secondary | ICD-10-CM | POA: Diagnosis present

## 2016-04-23 DIAGNOSIS — O99284 Endocrine, nutritional and metabolic diseases complicating childbirth: Secondary | ICD-10-CM | POA: Diagnosis present

## 2016-04-23 DIAGNOSIS — K219 Gastro-esophageal reflux disease without esophagitis: Secondary | ICD-10-CM | POA: Diagnosis present

## 2016-04-23 DIAGNOSIS — Z3A40 40 weeks gestation of pregnancy: Secondary | ICD-10-CM

## 2016-04-23 DIAGNOSIS — Z3493 Encounter for supervision of normal pregnancy, unspecified, third trimester: Secondary | ICD-10-CM | POA: Diagnosis present

## 2016-04-23 DIAGNOSIS — O9902 Anemia complicating childbirth: Secondary | ICD-10-CM | POA: Diagnosis present

## 2016-04-23 DIAGNOSIS — O9962 Diseases of the digestive system complicating childbirth: Secondary | ICD-10-CM | POA: Diagnosis present

## 2016-04-23 LAB — TYPE AND SCREEN
ABO/RH(D): O POS
Antibody Screen: NEGATIVE

## 2016-04-23 LAB — CBC
HEMATOCRIT: 38.5 % (ref 35.0–47.0)
Hemoglobin: 13.1 g/dL (ref 12.0–16.0)
MCH: 32 pg (ref 26.0–34.0)
MCHC: 34 g/dL (ref 32.0–36.0)
MCV: 94.1 fL (ref 80.0–100.0)
PLATELETS: 256 10*3/uL (ref 150–440)
RBC: 4.1 MIL/uL (ref 3.80–5.20)
RDW: 13 % (ref 11.5–14.5)
WBC: 13.3 10*3/uL — ABNORMAL HIGH (ref 3.6–11.0)

## 2016-04-23 MED ORDER — DIPHENHYDRAMINE HCL 50 MG/ML IJ SOLN: 12.5000 mg | INTRAMUSCULAR | Status: DC | PRN

## 2016-04-23 MED ORDER — LIDOCAINE HCL (PF) 1 % IJ SOLN
INTRAMUSCULAR | Status: AC
  Filled 2016-04-23: qty 30

## 2016-04-23 MED ORDER — LIDOCAINE HCL (PF) 1 % IJ SOLN
INTRAMUSCULAR | Status: DC | PRN
  Administered 2016-04-23: 3 mL via SUBCUTANEOUS

## 2016-04-23 MED ORDER — ACETAMINOPHEN 325 MG PO TABS: 650.0000 mg | ORAL_TABLET | ORAL | Status: DC | PRN

## 2016-04-23 MED ORDER — SODIUM CHLORIDE 0.9% FLUSH: 3.0000 mL | INTRAVENOUS | Status: DC | PRN

## 2016-04-23 MED ORDER — NALOXONE HCL 0.4 MG/ML IJ SOLN: 0.4000 mg | INTRAMUSCULAR | Status: DC | PRN

## 2016-04-23 MED ORDER — OXYTOCIN 40 UNITS IN LACTATED RINGERS INFUSION - SIMPLE MED
2.5000 [IU]/h | INTRAVENOUS | Status: DC
  Filled 2016-04-23: qty 1000

## 2016-04-23 MED ORDER — DIPHENHYDRAMINE HCL 25 MG PO CAPS: 25.0000 mg | ORAL_CAPSULE | Freq: Four times a day (QID) | ORAL | Status: DC | PRN

## 2016-04-23 MED ORDER — BENZOCAINE-MENTHOL 20-0.5 % EX AERO
1.0000 "application " | INHALATION_SPRAY | CUTANEOUS | Status: DC | PRN
  Administered 2016-04-23: 1 via TOPICAL
  Filled 2016-04-23: qty 56

## 2016-04-23 MED ORDER — MEASLES, MUMPS & RUBELLA VAC ~~LOC~~ INJ
0.5000 mL | INJECTION | Freq: Once | SUBCUTANEOUS | Status: DC
  Filled 2016-04-23: qty 0.5

## 2016-04-23 MED ORDER — OXYTOCIN BOLUS FROM INFUSION: 500.0000 mL | Freq: Once | INTRAVENOUS | Status: DC

## 2016-04-23 MED ORDER — TETANUS-DIPHTH-ACELL PERTUSSIS 5-2.5-18.5 LF-MCG/0.5 IM SUSP: 0.5000 mL | Freq: Once | INTRAMUSCULAR | Status: DC

## 2016-04-23 MED ORDER — FENTANYL 2.5 MCG/ML W/ROPIVACAINE 0.2% IN NS 100 ML EPIDURAL INFUSION (ARMC-ANES)
EPIDURAL | Status: AC
  Filled 2016-04-23: qty 100

## 2016-04-23 MED ORDER — NALBUPHINE HCL 10 MG/ML IJ SOLN: 5.0000 mg | INTRAMUSCULAR | Status: DC | PRN

## 2016-04-23 MED ORDER — OXYTOCIN 40 UNITS IN LACTATED RINGERS INFUSION - SIMPLE MED
INTRAVENOUS | Status: AC
  Filled 2016-04-23: qty 1000

## 2016-04-23 MED ORDER — BUTORPHANOL TARTRATE 1 MG/ML IJ SOLN
1.0000 mg | INTRAMUSCULAR | Status: DC | PRN
  Administered 2016-04-23 (×2): 1 mg via INTRAVENOUS
  Filled 2016-04-23 (×2): qty 1

## 2016-04-23 MED ORDER — NALBUPHINE HCL 10 MG/ML IJ SOLN: 5.0000 mg | Freq: Once | INTRAMUSCULAR | Status: DC | PRN

## 2016-04-23 MED ORDER — LACTATED RINGERS IV SOLN: 500.0000 mL | INTRAVENOUS | Status: DC | PRN

## 2016-04-23 MED ORDER — ONDANSETRON HCL 4 MG/2ML IJ SOLN: 4.0000 mg | Freq: Four times a day (QID) | INTRAMUSCULAR | Status: DC | PRN

## 2016-04-23 MED ORDER — FENTANYL 2.5 MCG/ML W/ROPIVACAINE 0.2% IN NS 100 ML EPIDURAL INFUSION (ARMC-ANES)
EPIDURAL | Status: DC | PRN
  Administered 2016-04-23: 10 mL/h via EPIDURAL

## 2016-04-23 MED ORDER — LIDOCAINE HCL (PF) 1 % IJ SOLN: 30.0000 mL | INTRAMUSCULAR | Status: DC | PRN

## 2016-04-23 MED ORDER — DIPHENHYDRAMINE HCL 25 MG PO CAPS: 25.0000 mg | ORAL_CAPSULE | ORAL | Status: DC | PRN

## 2016-04-23 MED ORDER — PRENATAL MULTIVITAMIN CH
1.0000 | ORAL_TABLET | Freq: Every day | ORAL | Status: DC
  Administered 2016-04-24 – 2016-04-25 (×2): 1 via ORAL
  Filled 2016-04-23 (×2): qty 1

## 2016-04-23 MED ORDER — LACTATED RINGERS IV SOLN
INTRAVENOUS | Status: DC
  Administered 2016-04-23 (×3): via INTRAVENOUS

## 2016-04-23 MED ORDER — FENTANYL 2.5 MCG/ML W/ROPIVACAINE 0.2% IN NS 100 ML EPIDURAL INFUSION (ARMC-ANES): 10.0000 mL/h | EPIDURAL | Status: DC

## 2016-04-23 MED ORDER — SIMETHICONE 80 MG PO CHEW: 80.0000 mg | CHEWABLE_TABLET | ORAL | Status: DC | PRN

## 2016-04-23 MED ORDER — MISOPROSTOL 200 MCG PO TABS
ORAL_TABLET | ORAL | Status: AC
  Filled 2016-04-23: qty 4

## 2016-04-23 MED ORDER — NALOXONE HCL 2 MG/2ML IJ SOSY
1.0000 ug/kg/h | PREFILLED_SYRINGE | INTRAVENOUS | Status: DC | PRN
  Filled 2016-04-23: qty 2

## 2016-04-23 MED ORDER — WITCH HAZEL-GLYCERIN EX PADS: 1.0000 "application " | MEDICATED_PAD | CUTANEOUS | Status: DC | PRN

## 2016-04-23 MED ORDER — SOD CITRATE-CITRIC ACID 500-334 MG/5ML PO SOLN
30.0000 mL | ORAL | Status: DC | PRN
  Filled 2016-04-23: qty 30

## 2016-04-23 MED ORDER — SODIUM CHLORIDE 0.9 % IV SOLN: 250.0000 mL | INTRAVENOUS | Status: DC | PRN

## 2016-04-23 MED ORDER — OXYTOCIN 10 UNIT/ML IJ SOLN
INTRAMUSCULAR | Status: AC
  Filled 2016-04-23: qty 2

## 2016-04-23 MED ORDER — OXYTOCIN 40 UNITS IN LACTATED RINGERS INFUSION - SIMPLE MED
1.0000 m[IU]/min | INTRAVENOUS | Status: DC
  Administered 2016-04-23: 1 m[IU]/min via INTRAVENOUS

## 2016-04-23 MED ORDER — IBUPROFEN 600 MG PO TABS
600.0000 mg | ORAL_TABLET | Freq: Four times a day (QID) | ORAL | Status: DC
  Administered 2016-04-23 – 2016-04-25 (×7): 600 mg via ORAL
  Filled 2016-04-23 (×7): qty 1

## 2016-04-23 MED ORDER — COCONUT OIL OIL
1.0000 "application " | TOPICAL_OIL | Status: DC | PRN
  Administered 2016-04-24: 1 via TOPICAL
  Filled 2016-04-23: qty 120

## 2016-04-23 MED ORDER — BISACODYL 10 MG RE SUPP: 10.0000 mg | Freq: Every day | RECTAL | Status: DC | PRN

## 2016-04-23 MED ORDER — ONDANSETRON HCL 4 MG PO TABS: 4.0000 mg | ORAL_TABLET | ORAL | Status: DC | PRN

## 2016-04-23 MED ORDER — SENNOSIDES-DOCUSATE SODIUM 8.6-50 MG PO TABS
2.0000 | ORAL_TABLET | ORAL | Status: DC
  Administered 2016-04-24 – 2016-04-25 (×2): 2 via ORAL
  Filled 2016-04-23 (×2): qty 2

## 2016-04-23 MED ORDER — SODIUM CHLORIDE 0.9% FLUSH: 3.0000 mL | Freq: Two times a day (BID) | INTRAVENOUS | Status: DC

## 2016-04-23 MED ORDER — AMMONIA AROMATIC IN INHA
RESPIRATORY_TRACT | Status: AC
  Filled 2016-04-23: qty 10

## 2016-04-23 MED ORDER — ZOLPIDEM TARTRATE 5 MG PO TABS: 5.0000 mg | ORAL_TABLET | Freq: Every evening | ORAL | Status: DC | PRN

## 2016-04-23 MED ORDER — BUPIVACAINE HCL (PF) 0.25 % IJ SOLN
INTRAMUSCULAR | Status: DC | PRN
  Administered 2016-04-23 (×2): 5 mL via EPIDURAL

## 2016-04-23 MED ORDER — LIDOCAINE-EPINEPHRINE (PF) 1.5 %-1:200000 IJ SOLN
INTRAMUSCULAR | Status: DC | PRN
  Administered 2016-04-23: 3 mL via EPIDURAL

## 2016-04-23 MED ORDER — TERBUTALINE SULFATE 1 MG/ML IJ SOLN: 0.2500 mg | Freq: Once | INTRAMUSCULAR | Status: DC | PRN

## 2016-04-23 MED ORDER — DIBUCAINE 1 % RE OINT: 1.0000 "application " | TOPICAL_OINTMENT | RECTAL | Status: DC | PRN

## 2016-04-23 MED ORDER — ONDANSETRON HCL 4 MG/2ML IJ SOLN: 4.0000 mg | INTRAMUSCULAR | Status: DC | PRN

## 2016-04-23 MED ORDER — FLEET ENEMA 7-19 GM/118ML RE ENEM: 1.0000 | ENEMA | Freq: Every day | RECTAL | Status: DC | PRN

## 2016-04-23 NOTE — Discharge Summary (Signed)
Obstetric Discharge Summary Reason for Admission: onset of labor Prenatal Procedures: ultrasound/[facility] patient  Intrapartum Procedures: spontaneous vaginal delivery Postpartum Procedures: none Complications-Operative and Postpartum: 2nd degree perineal laceration Hemoglobin  Date Value Ref Range Status  04/24/2016 10.9 (L) 12.0 - 16.0 g/dL Final   HGB  Date Value Ref Range Status  06/18/2013 15.3 12.0 - 16.0 g/dL Final   HCT  Date Value Ref Range Status  04/24/2016 31.1 (L) 35.0 - 47.0 % Final   Hematocrit  Date Value Ref Range Status  06/01/2015 42.7 34.0 - 46.6 % Final    Physical Exam:  General: alert, cooperative and appears stated age 7: appropriate, no clots  Uterine Fundus: firm,  Perineum: healing well DVT Evaluation: No evidence of DVT seen on physical exam.  Discharge Diagnoses: Term Pregnancy-delivered; leukocytosis- resolved Iron deficiency anemia: Home with iron and vitamin C  Discharge Information: Date: 04/25/2016 Activity: pelvic rest Diet: routine Medications: PNV, Ibuprofen and Iron Condition: stable Instructions: refer to practice specific booklet and No driving x 2 weeks, no sex x 6 weeks Discharge to: home Call office for fever of 101, heavy vaginal bleeding more than a pad an hour, breast infection or any other concerns.  Newborn Data: Live born female  Birth Weight:   APGAR: 30, 9  Home with mother.  Benjaman Kindler 04/25/2016, 8:03 AM

## 2016-04-23 NOTE — Anesthesia Procedure Notes (Signed)
Epidural  Start time: 04/23/2016 8:13 AM End time: 04/23/2016 8:27 AM  Staffing Anesthesiologist: Andria Frames Resident/CRNA: Aline Brochure Performed: resident/CRNA   Preanesthetic Checklist Completed: patient identified, site marked, surgical consent, pre-op evaluation, IV checked, risks and benefits discussed and monitors and equipment checked  Epidural Patient position: sitting Prep: ChloraPrep Patient monitoring: cardiac monitor, continuous pulse ox and blood pressure Approach: midline Location: L3-L4 Injection technique: LOR air  Needle:  Needle type: Tuohy  Needle gauge: 17 G Needle length: 9 cm Needle insertion depth: 5 cm Catheter type: closed end flexible Catheter size: 19 Gauge Catheter at skin depth: 9 cm Test dose: negative and 1.5% lidocaine with Epi 1:200 K  Assessment Events: blood not aspirated, injection not painful, no injection resistance, negative IV test and no paresthesia  Additional Notes Reason for block:procedure for pain

## 2016-04-23 NOTE — Progress Notes (Addendum)
Vicki Walters is a 41 y.o. G2P0010 at [redacted]w[redacted]d by exact LMP of 07/15/15  with  EDD of 04/20/16 with labor S/S and cervical progress to 7 cms with light mec on AROM per Dr Ouida Sills this am. Pt is doing well after Stadol. Pt is hearing impaired but, has hearing aides in but, still has some difficulty communicating. Husband with pt and helps to interpret for her.   Subjective: "I want the epidural"   Objective: BP 106/67   Pulse 74   Temp 98.1 F (36.7 C) (Oral)   Resp 18   Ht 5\' 5"  (1.651 m)   Wt 88.9 kg (196 lb)   LMP 06/20/2015   BMI 32.62 kg/m  No intake/output data recorded. No intake/output data recorded.  FHT: 1130, +accels x 2 in 20 min period, Reactive NST, Cat 1, mod variability UC:   q 5-5.5 mins, sl mod to palpation SVE:   Dilation: 7 Effacement (%): 100 Station: 0 Exam by:: Dr. Ouida Sills  Labs: Lab Results  Component Value Date   WBC 13.3 (H) 04/23/2016   HGB 13.1 04/23/2016   HCT 38.5 04/23/2016   MCV 94.1 04/23/2016   PLT 256 04/23/2016    Assessment / Plan: A:IUP at 40 3/7 weeks 2. Active labor pattern 3. Lt mec noted on AROM 4. Fetus: Cat 1 strip P: Epidural planned 2. Continue to monitor UC/FHT's 3. Antic SVD.  Labor: Progressing Preeclampsia:  None noted Fetal Wellbeing: reassuring  Pain Control: Stadol and plans epidural Anticipated MOD: NSVD  Vicki Walters 04/23/2016, 7:57 AM

## 2016-04-23 NOTE — H&P (Signed)
Coleta Grosshans is a 41 y.o. female presenting for active labor . Was sent home earlier in the day with latent labor .  40+3 weeks  H/O hyperthyroidism without medication .AMA with nl genetic screening  OB History    Gravida Para Term Preterm AB Living   2       1     SAB TAB Ectopic Multiple Live Births                 Past Medical History:  Diagnosis Date  . GERD (gastroesophageal reflux disease)   . Thyroid disease   congenital hearing loss   History reviewed. No pertinent surgical history. Family History: family history includes Breast cancer (age of onset: 80) in her sister; Cancer in her maternal grandfather; Diabetes in her paternal grandfather; Heart disease in her maternal grandmother. Social History:  reports that she quit smoking about 9 years ago. She has a 5.00 pack-year smoking history. She has never used smokeless tobacco. She reports that she drinks about 3.0 oz of alcohol per week . She reports that she does not use drugs.     Maternal Diabetes: No Genetic Screening: Normal Maternal Ultrasounds/Referrals: Normal Fetal Ultrasounds or other Referrals:  None Maternal Substance Abuse:  No Significant Maternal Medications:  None Significant Maternal Lab Results:  None Other Comments:  None  ROS History cx : 7 cm / c/0 /VTX Thin meconium noted on AROM  Blood pressure 114/78, pulse 79, temperature 98.1 F (36.7 C), temperature source Oral, resp. rate 18, height 5\' 5"  (1.651 m), weight 88.9 kg (196 lb), last menstrual period 06/20/2015. Exam Physical Exam  Lungs CTA  CV RRR   Abd gravid soft  Cervix as above   fetal monitoring : reassuring , no decels , ctx q 3 min  Prenatal labs: ABO, Rh: --/--/O POS (04/02 0350) Antibody: NEG (04/02 0350) Rubella: Immune (09/15 0000) RPR: Nonreactive (03/01 0000)  HBsAg: Negative (09/15 0000)  HIV: Non-reactive (09/15 0000)  GBS: Negative (03/01 0000)   Assessment/Plan: Active labor  Reassuring fetal monitoring   CLE  offered , stadol otherwise .   SCHERMERHORN,THOMAS 04/23/2016, 7:22 AM

## 2016-04-23 NOTE — Discharge Instructions (Signed)
Care After Vaginal Delivery °Congratulations on your new baby!! ° °Refer to this sheet in the next few weeks. These discharge instructions provide you with information on caring for yourself after delivery. Your caregiver may also give you specific instructions. Your treatment has been planned according to the most current medical practices available, but problems sometimes occur. Call your caregiver if you have any problems or questions after you go home. ° °HOME CARE INSTRUCTIONS °· Take over-the-counter or prescription medicines only as directed by your caregiver or pharmacist. °· Do not drink alcohol, especially if you are breastfeeding or taking medicine to relieve pain. °· Do not chew or smoke tobacco. °· Do not use illegal drugs. °· Continue to use good perineal care. Good perineal care includes: °¨ Wiping your perineum from front to back. °¨ Keeping your perineum clean. °· Do not use tampons or douche until your caregiver says it is okay. °· Shower, wash your hair, and take tub baths as directed by your caregiver. °· Wear a well-fitting bra that provides breast support. °· Eat healthy foods. °· Drink enough fluids to keep your urine clear or pale yellow. °· Eat high-fiber foods such as whole grain cereals and breads, brown rice, beans, and fresh fruits and vegetables every day. These foods may help prevent or relieve constipation. °· Follow your caregiver's recommendations regarding resumption of activities such as climbing stairs, driving, lifting, exercising, or traveling. Specifically, no driving for two weeks, so that you are comfortable reacting quickly in an emergency. °· Talk to your caregiver about resuming sexual activities. Resumption of sexual activities is dependent upon your risk of infection, your rate of healing, and your comfort and desire to resume sexual activity. Usually we recommend waiting about six weeks, or until your bleeding stops and you are interested in sex. °· Try to have someone  help you with your household activities and your newborn for at least a few days after you leave the hospital. Even longer is better. °· Rest as much as possible. Try to rest or take a nap when your newborn is sleeping. Sleep deprivation can be very hard after delivery. °· Increase your activities gradually. °· Keep all of your scheduled postpartum appointments. It is very important to keep your scheduled follow-up appointments. At these appointments, your caregiver will be checking to make sure that you are healing physically and emotionally. ° °SEEK MEDICAL CARE IF:  °· You are passing large clots from your vagina.  °· You have a foul smelling discharge from your vagina. °· You have trouble urinating. °· You are urinating frequently. °· You have pain when you urinate. °· You have a change in your bowel movements. °· You have increasing redness, pain, or swelling near your vaginal incision (episiotomy) or vaginal tear. °· You have pus draining from your episiotomy or vaginal tear. °· Your episiotomy or vaginal tear is separating. °· You have painful, hard, or reddened breasts. °· You have a severe headache. °· You have blurred vision or see spots. °· You feel sad or depressed. °· You have thoughts of hurting yourself or your newborn. °· You have questions about your care, the care of your newborn, or medicines. °· You are dizzy or light-headed. °· You have a rash. °· You have nausea or vomiting. °· You were breastfeeding and have not had a menstrual period within 12 weeks after you stopped breastfeeding. °· You are not breastfeeding and have not had a menstrual period by the 12th week after delivery. °· You   have a fever. ° °SEEK IMMEDIATE MEDICAL CARE IF:  °· You have persistent pain. °· You have chest pain. °· You have shortness of breath. °· You faint. °· You have leg pain. °· You have stomach pain. °· Your vaginal bleeding saturates two or more sanitary pads in 1 hour. ° °MAKE SURE YOU:  °· Understand these  instructions. °· Will get help right away if you are not doing well or get worse. °·  °Document Released: 01/06/2000 Document Revised: 05/25/2013 Document Reviewed: 09/05/2011 ° °Exit[employer]® Patient Information ©2015 [employer]. This information is not intended to replace advice given to you by your health care provider. Make sure you discuss any questions you have with your health care provider. ° °

## 2016-04-23 NOTE — Progress Notes (Signed)
Vicki Walters is a 41 y.o. G2P0010 at [redacted]w[redacted]d laboring with UC's spanning out to q 4-7 mins. Pt still does not feel rectal pressure.   Subjective: "I cannot feel the urge to push" Objective: BP 116/65   Pulse 63   Temp 97.7 F (36.5 C) (Oral)   Resp 18   Ht 5\' 5"  (1.651 m)   Wt 88.9 kg (196 lb)   LMP 06/20/2015   SpO2 98%   BMI 32.62 kg/m  No intake/output data recorded. No intake/output data recorded.  FHT: 130, Cat 1 UC:   q 4-7 mins SVE:   Dilation: 10 Effacement (%): 100 Station: +1 Exam by:: Robin Searing RN  Labs: Lab Results  Component Value Date   WBC 13.3 (H) 04/23/2016   HGB 13.1 04/23/2016   HCT 38.5 04/23/2016   MCV 94.1 04/23/2016   PLT 256 04/23/2016    Assessment / Plan: A: IUP at term 2. Pt is hearing impaired and using hearing aides P: 1. Monitor fetal and uterine contractions 2. Epidural turned down to 6 3. Start Pitocin per protocol and will aggresiveley push with urge 4. Antic SVD.   Catheryn Bacon 04/23/2016, 3:59 PM

## 2016-04-23 NOTE — Anesthesia Preprocedure Evaluation (Signed)
Anesthesia Evaluation  Patient identified by MRN, date of birth, ID band Patient awake    Reviewed: Allergy & Precautions, H&P , NPO status , Patient's Chart, lab work & pertinent test results  History of Anesthesia Complications Negative for: history of anesthetic complications  Airway Mallampati: II   Neck ROM: full    Dental no notable dental hx. (+) Teeth Intact   Pulmonary former smoker,           Cardiovascular      Neuro/Psych    GI/Hepatic GERD  ,  Endo/Other    Renal/GU      Musculoskeletal   Abdominal   Peds  Hematology   Anesthesia Other Findings   Reproductive/Obstetrics (+) Pregnancy                             Anesthesia Physical Anesthesia Plan  ASA: II  Anesthesia Plan: Epidural   Post-op Pain Management:    Induction:   Airway Management Planned:   Additional Equipment:   Intra-op Plan:   Post-operative Plan:   Informed Consent: I have reviewed the patients History and Physical, chart, labs and discussed the procedure including the risks, benefits and alternatives for the proposed anesthesia with the patient or authorized representative who has indicated his/her understanding and acceptance.     Plan Discussed with: Anesthesiologist  Anesthesia Plan Comments:         Anesthesia Quick Evaluation

## 2016-04-23 NOTE — Progress Notes (Signed)
Vicki Walters is a 41 y.o. G2P0010 at [redacted]w[redacted]d with active labor pattern and comfortable with epidural.  Subjective: I am comfortable   Objective: BP 121/62   Pulse 65   Temp 97.6 F (36.4 C) (Oral)   Resp 18   Ht 5\' 5"  (1.651 m)   Wt 88.9 kg (196 lb)   LMP 06/20/2015   SpO2 98%   BMI 32.62 kg/m  No intake/output data recorded. No intake/output data recorded.  FHT: 120, accels noted, Cat 1, no decels UC:  q 4-6 mins,  SVE:   Dilation: 8 Effacement (%): 100 Station: +1 Exam by:: Robin Searing RN  Labs: Lab Results  Component Value Date   WBC 13.3 (H) 04/23/2016   HGB 13.1 04/23/2016   HCT 38.5 04/23/2016   MCV 94.1 04/23/2016   PLT 256 04/23/2016    Assessment / Plan: A: IUP at term 2. Deafness in pt P: 1. Continue to monitor UC/FHT's 2. Continue epidural    Catheryn Bacon 04/23/2016, 11:16 AM

## 2016-04-23 NOTE — OB Triage Note (Signed)
Pt arrived to unit complaining of contractions that increased in frequency and intensity starting around 11:30 pm on 04/22/16. Denies leaking of fluid or bleeding. +FM. Denies HA, blurred vision or dysuria.

## 2016-04-24 LAB — CBC
HCT: 31.1 % — ABNORMAL LOW (ref 35.0–47.0)
Hemoglobin: 10.9 g/dL — ABNORMAL LOW (ref 12.0–16.0)
MCH: 32.8 pg (ref 26.0–34.0)
MCHC: 35.1 g/dL (ref 32.0–36.0)
MCV: 93.4 fL (ref 80.0–100.0)
PLATELETS: 228 10*3/uL (ref 150–440)
RBC: 3.34 MIL/uL — AB (ref 3.80–5.20)
RDW: 12.9 % (ref 11.5–14.5)
WBC: 14.3 10*3/uL — ABNORMAL HIGH (ref 3.6–11.0)

## 2016-04-24 LAB — RPR: RPR Ser Ql: NONREACTIVE

## 2016-04-24 NOTE — Lactation Note (Signed)
This note was copied from a baby's chart. Lactation Consultation Note  Patient Name: Vicki Walters Today's Date: 04/24/2016 Reason for consult: Initial assessment P1 Mom who is hard of hearing and very private/shy. Baby has adequate diaper count. Mom denies pain or problems. I reviewed BFing basics while pointing out key points in her BF booklet to help with communication. She was also given info on [facility] support group. Dad very supportive.   Maternal Data    Feeding Feeding Type: Breast Fed Length of feed: 25 min  LATCH Score/Interventions Latch: Grasps breast easily, tongue down, lips flanged, rhythmical sucking.  Audible Swallowing: A few with stimulation Intervention(s): Hand expression  Type of Nipple: Everted at rest and after stimulation  Comfort (Breast/Nipple): Soft / non-tender     Hold (Positioning): Assistance needed to correctly position infant at breast and maintain latch. Intervention(s): Breastfeeding basics reviewed;Support Pillows;Position options  LATCH Score: 8  Lactation Tools Discussed/Used     Consult Status      Roque Cash 04/24/2016, 10:20 AM

## 2016-04-24 NOTE — Progress Notes (Signed)
Post Partum Day 1 Subjective: no complaints, up ad lib, voiding and tolerating PO  Objective: Blood pressure 105/63, pulse 76, temperature 98.2 F (36.8 C), temperature source Oral, resp. rate 16, height 5\' 5"  (1.651 m), weight 196 lb (88.9 kg), last menstrual period 06/20/2015, SpO2 99 %.  Physical Exam:  General: alert and cooperative Lochia: appropriate Uterine Fundus: firm DVT Evaluation: No evidence of DVT seen on physical exam.   Recent Labs  04/23/16 0350 04/24/16 0412  HGB 13.1 10.9*  HCT 38.5 31.1*    Assessment/Plan: Plan for discharge tomorrow and Breastfeeding   LOS: 1 day   Ashlynd Michna 04/24/2016, 8:49 AM

## 2016-04-24 NOTE — Anesthesia Postprocedure Evaluation (Signed)
Anesthesia Post Note  Patient: Vicki Walters  Procedure(s) Performed: * No procedures listed *  Patient location during evaluation: Mother Baby Anesthesia Type: Epidural Level of consciousness: awake, awake and alert and oriented Pain management: pain level controlled Vital Signs Assessment: post-procedure vital signs reviewed and stable Respiratory status: spontaneous breathing Cardiovascular status: blood pressure returned to baseline Postop Assessment: no signs of nausea or vomiting, adequate PO intake, no headache and no backache Anesthetic complications: no     Last Vitals:  Vitals:   04/23/16 2322 04/24/16 0321  BP: (!) 105/55 101/64  Pulse: 90 82  Resp: 18 18  Temp: 36.7 C 36.6 C    Last Pain:  Vitals:   04/24/16 0321  TempSrc: Oral  PainSc:                  Hedda Slade

## 2016-04-25 LAB — CBC WITH DIFFERENTIAL/PLATELET
BASOS ABS: 0 10*3/uL (ref 0–0.1)
Basophils Relative: 0 %
EOS PCT: 0 %
Eosinophils Absolute: 0 10*3/uL (ref 0–0.7)
HCT: 29.5 % — ABNORMAL LOW (ref 35.0–47.0)
HEMOGLOBIN: 10 g/dL — AB (ref 12.0–16.0)
LYMPHS ABS: 1.3 10*3/uL (ref 1.0–3.6)
LYMPHS PCT: 9 %
MCH: 31.9 pg (ref 26.0–34.0)
MCHC: 33.9 g/dL (ref 32.0–36.0)
MCV: 94 fL (ref 80.0–100.0)
MONOS PCT: 5 %
Monocytes Absolute: 0.7 10*3/uL (ref 0.2–0.9)
Neutro Abs: 11.4 10*3/uL — ABNORMAL HIGH (ref 1.4–6.5)
Neutrophils Relative %: 86 %
PLATELETS: 230 10*3/uL (ref 150–440)
RBC: 3.14 MIL/uL — AB (ref 3.80–5.20)
RDW: 13.3 % (ref 11.5–14.5)
WBC: 13.5 10*3/uL — ABNORMAL HIGH (ref 3.6–11.0)

## 2016-04-25 MED ORDER — ONDANSETRON 4 MG PO TBDP: 4.0000 mg | ORAL_TABLET | Freq: Three times a day (TID) | ORAL | 0 refills | Status: DC | PRN

## 2016-04-25 MED ORDER — DOCUSATE SODIUM 100 MG PO CAPS: 100.0000 mg | ORAL_CAPSULE | Freq: Every day | ORAL | 3 refills | Status: DC | PRN

## 2016-04-25 MED ORDER — FERROUS SULFATE 325 (65 FE) MG PO TABS: 325.0000 mg | ORAL_TABLET | Freq: Two times a day (BID) | ORAL | 3 refills | Status: DC

## 2016-04-25 MED ORDER — IBUPROFEN 800 MG PO TABS: 800.0000 mg | ORAL_TABLET | Freq: Three times a day (TID) | ORAL | 0 refills | Status: AC | PRN

## 2016-04-25 MED ORDER — ASCORBIC ACID 250 MG PO TABS: 250.0000 mg | ORAL_TABLET | Freq: Two times a day (BID) | ORAL | 3 refills | Status: AC

## 2016-04-25 NOTE — Lactation Note (Signed)
This note was copied from a baby's chart. Lactation Consultation Note  Patient Name: Vicki Walters [id number] Date: 04/25/2016 Reason for consult: Follow-up assessment I spent time answering breastfeeding questions; reviewing hand expression of her milk; skin to skin care; cluster feeds; how to tell baby is well nourished, etc. She demonstrated correct position and latch. They have [place] contact info and Support group info.   Maternal Data    Feeding Feeding Type: Breast Fed Length of feed: 20 min  LATCH Score/Interventions Latch: Grasps breast easily, tongue down, lips flanged, rhythmical sucking.  Audible Swallowing: A few with stimulation Intervention(s): Hand expression;Skin to skin  Type of Nipple: Everted at rest and after stimulation  Comfort (Breast/Nipple): Soft / non-tender     Hold (Positioning): Assistance needed to correctly position infant at breast and maintain latch. Intervention(s): Support Pillows  LATCH Score: 8  Lactation Tools Discussed/Used     Consult Status Consult Status: PRN    Roque Cash 04/25/2016, 10:49 AM

## 2016-04-25 NOTE — Progress Notes (Addendum)
Post Partum Day 2 Subjective: no complaints, up ad lib, voiding and tolerating PO  Objective: Blood pressure 105/63, pulse 79, temperature 98.4 F (36.9 C), temperature source Oral, resp. rate 18, height 5\' 5"  (1.651 m), weight 88.9 kg (196 lb), last menstrual period 06/20/2015, SpO2 100 %.  Physical Exam:  General: alert Lochia: appropriate Uterine Fundus: firm Incision: healing well DVT Evaluation: No evidence of DVT seen on physical exam.   Recent Labs  04/23/16 0350 04/24/16 0412  HGB 13.1 10.9*  HCT 38.5 31.1*    Assessment/Plan: Discharge home  FE and PNV's   LOS: 2 days   Catheryn Bacon 04/25/2016, 8:18 AM

## 2016-04-25 NOTE — Progress Notes (Signed)
Patient discharged home with infant and significant other. Discharge instructions, prescriptions and follow up appointment given to and reviewed with patient and significant other. Patient verbalized understanding. Escorted out via wheelchair by auxiliary.  

## 2016-04-25 NOTE — Discharge Summary (Signed)
Obstetric Discharge Summary Reason for Admission: onset of labor Prenatal Procedures: none and ultrasound Intrapartum Procedures: spontaneous vaginal delivery Postpartum Procedures: none Complications-Operative and Postpartum: none Hemoglobin  Date Value Ref Range Status  04/24/2016 10.9 (L) 12.0 - 16.0 g/dL Final   HGB  Date Value Ref Range Status  06/18/2013 15.3 12.0 - 16.0 g/dL Final   HCT  Date Value Ref Range Status  04/24/2016 31.1 (L) 35.0 - 47.0 % Final   Hematocrit  Date Value Ref Range Status  06/01/2015 42.7 34.0 - 46.6 % Final    Physical Exam:  General: alert, cooperative and appears stated age 41: appropriate Uterine Fundus: firm Incision: healing well DVT Evaluation: No evidence of DVT seen on physical exam.  Discharge Diagnoses: NSVD of viable female infant with 2nd degree lac  Discharge Information: Date: 04/25/2016 Activity: Up Ad Lib Diet: Regular Medications: PNV Condition: stable  Instructions: FU at 6 weeks, call for any concerns and take Fe Discharge to: Home Follow-up Information    Catheryn Bacon, CNM Follow up in 6 week(s).   Specialty:  Obstetrics and Gynecology Contact information: [address] [facility] [place] [address] [phone]           Newborn Data: Live born female  Birth Weight: 7 lb 0.2 oz (3180 g) APGAR: 8, 9  Home with baby  Catheryn Bacon 04/25/2016, 8:22 AM

## 2016-11-27 DIAGNOSIS — L2089 Other atopic dermatitis: Secondary | ICD-10-CM | POA: Diagnosis not present

## 2016-11-27 DIAGNOSIS — L578 Other skin changes due to chronic exposure to nonionizing radiation: Secondary | ICD-10-CM | POA: Diagnosis not present

## 2016-11-27 DIAGNOSIS — D485 Neoplasm of uncertain behavior of skin: Secondary | ICD-10-CM | POA: Diagnosis not present

## 2016-12-11 DIAGNOSIS — D485 Neoplasm of uncertain behavior of skin: Secondary | ICD-10-CM | POA: Diagnosis not present

## 2016-12-11 DIAGNOSIS — C44519 Basal cell carcinoma of skin of other part of trunk: Secondary | ICD-10-CM | POA: Diagnosis not present

## 2016-12-25 DIAGNOSIS — C44519 Basal cell carcinoma of skin of other part of trunk: Secondary | ICD-10-CM | POA: Diagnosis not present

## 2017-01-08 DIAGNOSIS — C44519 Basal cell carcinoma of skin of other part of trunk: Secondary | ICD-10-CM | POA: Diagnosis not present

## 2017-01-10 ENCOUNTER — Other Ambulatory Visit: Payer: Self-pay | Admitting: Obstetrics and Gynecology

## 2017-01-10 DIAGNOSIS — O9270 Unspecified disorders of lactation: Secondary | ICD-10-CM | POA: Diagnosis not present

## 2017-01-10 DIAGNOSIS — N6310 Unspecified lump in the right breast, unspecified quadrant: Secondary | ICD-10-CM

## 2017-01-21 ENCOUNTER — Other Ambulatory Visit: Payer: BLUE CROSS/BLUE SHIELD

## 2017-01-29 ENCOUNTER — Ambulatory Visit
Admission: RE | Admit: 2017-01-29 | Discharge: 2017-01-29 | Disposition: A | Discharge status: Home / Self Care | Payer: BLUE CROSS/BLUE SHIELD | Source: Ambulatory Visit | Attending: Obstetrics and Gynecology | Admitting: Obstetrics and Gynecology

## 2017-01-29 DIAGNOSIS — N6310 Unspecified lump in the right breast, unspecified quadrant: Secondary | ICD-10-CM | POA: Insufficient documentation

## 2017-03-20 DIAGNOSIS — D485 Neoplasm of uncertain behavior of skin: Secondary | ICD-10-CM | POA: Diagnosis not present

## 2017-03-20 DIAGNOSIS — D225 Melanocytic nevi of trunk: Secondary | ICD-10-CM | POA: Diagnosis not present

## 2017-04-16 DIAGNOSIS — C44519 Basal cell carcinoma of skin of other part of trunk: Secondary | ICD-10-CM | POA: Diagnosis not present

## 2017-06-25 DIAGNOSIS — Z85828 Personal history of other malignant neoplasm of skin: Secondary | ICD-10-CM | POA: Diagnosis not present

## 2017-06-25 DIAGNOSIS — D485 Neoplasm of uncertain behavior of skin: Secondary | ICD-10-CM | POA: Diagnosis not present

## 2017-06-25 DIAGNOSIS — D225 Melanocytic nevi of trunk: Secondary | ICD-10-CM | POA: Diagnosis not present

## 2017-06-25 DIAGNOSIS — R898 Other abnormal findings in specimens from other organs, systems and tissues: Secondary | ICD-10-CM | POA: Diagnosis not present

## 2017-06-25 DIAGNOSIS — L578 Other skin changes due to chronic exposure to nonionizing radiation: Secondary | ICD-10-CM | POA: Diagnosis not present

## 2017-06-25 DIAGNOSIS — Z86018 Personal history of other benign neoplasm: Secondary | ICD-10-CM | POA: Diagnosis not present

## 2017-07-04 ENCOUNTER — Other Ambulatory Visit: Payer: Self-pay | Admitting: Obstetrics and Gynecology

## 2017-07-04 DIAGNOSIS — Z01419 Encounter for gynecological examination (general) (routine) without abnormal findings: Secondary | ICD-10-CM | POA: Diagnosis not present

## 2017-07-04 DIAGNOSIS — Z1231 Encounter for screening mammogram for malignant neoplasm of breast: Secondary | ICD-10-CM | POA: Diagnosis not present

## 2017-07-04 DIAGNOSIS — N952 Postmenopausal atrophic vaginitis: Secondary | ICD-10-CM | POA: Diagnosis not present

## 2017-11-12 DIAGNOSIS — Z111 Encounter for screening for respiratory tuberculosis: Secondary | ICD-10-CM | POA: Diagnosis not present

## 2017-11-14 DIAGNOSIS — Z111 Encounter for screening for respiratory tuberculosis: Secondary | ICD-10-CM | POA: Diagnosis not present

## 2018-03-20 ENCOUNTER — Ambulatory Visit (INDEPENDENT_AMBULATORY_CARE_PROVIDER_SITE_OTHER): Payer: BLUE CROSS/BLUE SHIELD | Admitting: Nurse Practitioner

## 2018-03-20 ENCOUNTER — Encounter: Payer: Self-pay | Admitting: Nurse Practitioner

## 2018-03-20 ENCOUNTER — Other Ambulatory Visit: Payer: Self-pay

## 2018-03-20 VITALS — BP 114/65 | HR 68 | Temp 97.7°F | Ht 65.0 in | Wt 149.6 lb

## 2018-03-20 DIAGNOSIS — Z803 Family history of malignant neoplasm of breast: Secondary | ICD-10-CM | POA: Diagnosis not present

## 2018-03-20 DIAGNOSIS — R635 Abnormal weight gain: Secondary | ICD-10-CM | POA: Diagnosis not present

## 2018-03-20 NOTE — Progress Notes (Signed)
Subjective:    Patient ID: Vicki Walters, female    DOB: [birth year], 43 y.o.   MRN: [id number]  Vicki Walters is a 43 y.o. female presenting on 03/20/2018 for Weight Check (pt concern about her recent weight gain. Would like to get her thyroid checked. Pt would like to get a breast exam, because she was unable to get a mammogram this year because shes breastfeeding.)   HPI Weight gain Patient has had recent weight gain.  Before Christmas, weighted 139 and has gained 10 lbs in 2 months, which is very abnormal for patient.  Feels tingling sometimes in her body.  For 2 weeeks felt weird with tingling and pain in muscles.  This has resolved.  - Has had floaters and flashes in vision in past lasting only 20 minutes.  Is waiting on eye exam which is not yet scheduled as patient does not know who is in network.  Breast cancer screening Patient desires screening, however was not offered at her last GYN physical due to breastfeeding.  Her daughter is almost 6 years old and is still feeding 3 times daily.  No known timeline for quitting.   Twin sister had breast cancer in 94s, so patient is concerned about  - Patient is not feeling any changes in breast tissue.   - Has occasional sharp pains in armpit.  Has [place] in RIGHT axilla with negative findings.   Social History   Tobacco Use  . Smoking status: Former Smoker    Packs/day: 0.50    Years: 10.00    Pack years: 5.00    Last attempt to quit: 03/10/2007    Years since quitting: 11.0  . Smokeless tobacco: Never Used  Substance Use Topics  . Alcohol use: Not Currently  . Drug use: No    Review of Systems Per HPI unless specifically indicated above     Objective:    BP 114/65 (BP Location: Right Arm, Patient Position: Sitting, Cuff Size: Normal)   Pulse 68   Temp 97.7 F (36.5 C) (Oral)   Ht 5\' 5"  (1.651 m)   Wt 149 lb 9.6 oz (67.9 kg)   LMP 03/03/2018   Breastfeeding Yes   BMI 24.89 kg/m   Wt Readings from Last 3  Encounters:  03/20/18 149 lb 9.6 oz (67.9 kg)  04/23/16 196 lb (88.9 kg)  01/12/16 182 lb (82.6 kg)    Physical Exam Vitals signs reviewed.  Constitutional:      General: She is not in acute distress.    Appearance: She is well-developed.  HENT:     Head: Normocephalic and atraumatic.  Neck:     Musculoskeletal: Full passive range of motion without pain, normal range of motion and neck supple.     Thyroid: No thyromegaly.  Cardiovascular:     Rate and Rhythm: Normal rate and regular rhythm.     Pulses:          Radial pulses are 2+ on the right side and 2+ on the left side.       Posterior tibial pulses are 1+ on the right side and 1+ on the left side.     Heart sounds: Normal heart sounds, S1 normal and S2 normal.  Pulmonary:     Effort: Pulmonary effort is normal. No respiratory distress.     Breath sounds: Normal breath sounds and air entry.  Chest:     Comments: Breast - Normal exam w/ symmetric breasts, patient with very lumpy/fibrocystic breasts c/w breastfeeding,  no nipple discharge, no skin changes or tenderness.   Musculoskeletal:     Right lower leg: No edema.     Left lower leg: No edema.  Skin:    General: Skin is warm and dry.     Capillary Refill: Capillary refill takes less than 2 seconds.  Neurological:     Mental Status: She is alert and oriented to person, place, and time.  Psychiatric:        Attention and Perception: Attention normal.        Mood and Affect: Mood and affect normal.        Behavior: Behavior normal. Behavior is cooperative.    Results for orders placed or performed in visit on 03/20/18  TSH  Result Value Ref Range   TSH 0.465 0.450 - 4.500 uIU/mL  Hemoglobin A1c  Result Value Ref Range   Hgb A1c MFr Bld 5.0 4.8 - 5.6 %   Est. average glucose Bld gHb Est-mCnc 97 mg/dL      Assessment & Plan:   Problem List Items Addressed This Visit    None    Visit Diagnoses    Unexplained weight gain    -  Primary Very mild increase in  weight per patient report.  No other evidence that patient has any metabolic changes leading to weight gain.   Likely due to decrease in breastfeeding over time and no change to decrease dietary intake.  Plan: 1. Check TSH, A1c. 2. Continue working to eat healthy diet, live active lifestyle. 3. Follow-up for physical in 6 months.   Relevant Orders   TSH (Completed)   Hemoglobin A1c   Family history of breast cancer     No current evidence of breast cancer, but breast exam significantly limited by breastfeeding and activated mammary ducts.    Plan: 1. Reassured patient, will get screening after breastfeeding is complete. 2. Offered to patient to get mammo if breast pain or other hard lump/skin puckering is found. 3. Patient can also choose to stop breastfeeding for screening if concerned. 4. Follow-up 6 months at annual physical.       Follow up plan: Return in about 6 months (around 09/18/2018) for annual physical.  Cassell Smiles, DNP, AGPCNP-BC Adult Gerontology Primary Care Nurse Practitioner [facility] 03/20/2018, 3:28 PM

## 2018-03-20 NOTE — Patient Instructions (Addendum)
Vicki Walters,   Thank you for coming in to clinic today.  1. To find an in-network provider - please call your insurance company.  You may also call any of the offices below to ask about coverage and schedule an appointment.   [facility]   Address: [address] [place], [place] [address] Phone: [phone]  Website: [email or web address] [address] [place], [place] [address] Phone: [phone] [email or web address]  [facility]  Address: [address], [place], [place] [address] Phone: [phone]   [facility] [address] [place], [place] [address] Phone: [phone]  [facility] Address: [place], [place], [place] [address]  Phone: [phone]   2. We will consider mammogram once you are finished breastfeeding.  Please let us know when that time comes.  3. Labs today at [facility] for your thyroid exam.    Please schedule a follow-up appointment with Cassell Smiles, AGNP. Return in about 6 months (around 09/18/2018) for annual physical.  If you have any other questions or concerns, please feel free to call the clinic or send a message through [place]. You may also schedule an earlier appointment if necessary.  You will receive a survey after today's visit either digitally by e-mail or paper by [employer]. Your experiences and feedback matter to [place].  Please respond so we know how we are doing as we provide care for you.   Cassell Smiles, DNP, AGNP-BC Adult Gerontology Nurse Practitioner [place]

## 2018-03-21 LAB — HEMOGLOBIN A1C
Est. average glucose Bld gHb Est-mCnc: 97 mg/dL
Hgb A1c MFr Bld: 5 % (ref 4.8–5.6)

## 2018-03-21 LAB — TSH: TSH: 0.465 u[IU]/mL (ref 0.450–4.500)

## 2018-03-22 ENCOUNTER — Encounter: Payer: Self-pay | Admitting: Nurse Practitioner

## 2018-05-06 ENCOUNTER — Ambulatory Visit (INDEPENDENT_AMBULATORY_CARE_PROVIDER_SITE_OTHER): Payer: BLUE CROSS/BLUE SHIELD | Admitting: Nurse Practitioner

## 2018-05-06 ENCOUNTER — Other Ambulatory Visit: Payer: Self-pay

## 2018-05-06 ENCOUNTER — Encounter: Payer: Self-pay | Admitting: Nurse Practitioner

## 2018-05-06 DIAGNOSIS — K2900 Acute gastritis without bleeding: Secondary | ICD-10-CM | POA: Diagnosis not present

## 2018-05-06 MED ORDER — FAMOTIDINE 20 MG PO TABS: 20.0000 mg | ORAL_TABLET | Freq: Two times a day (BID) | ORAL | 0 refills | Status: DC

## 2018-05-06 NOTE — Progress Notes (Signed)
Telemedicine Encounter: Disclosed to patient at start of encounter that we will provide appropriate telemedicine services.  Patient consents to be treated via [email or web address] prior to discussion. - Patient is at her home and is accessed via telephone telemedicine with synchronous video/audio using [email or web address] - Services are provided by Cassell Smiles from [facility].  Subjective:    Patient ID: Vicki Walters, female    DOB: 11/04/1975, 43 y.o.   MRN: [id number]  Vicki Walters is a 43 y.o. female presenting on 05/06/2018 for Abdominal Pain (x 2 days stomach pain that she thought was gas. Mild headache and abnormal yellow bowel movement.)  HPI Abdominal pain Patient has mild headache, upper abdominal pain that is generalized across entire upper abdomen. Pain does not change with meals.  Previously had associated symptoms of bloating, but bloating is resolved today. - Patient has had no sharp pains, fever, chills, or sweats.  Diarrhea is also denied, but patient endorses more frequent, smaller volume BM. Change in BM color was also noted last week without any abdominal symptoms.  Returned to a yellow color today.   - Patient has had history of acid reflux, no regular heartburn at this time. - Mother had history of gall bladder problems.  Social History   Tobacco Use  . Smoking status: Former Smoker    Packs/day: 0.50    Years: 10.00    Pack years: 5.00    Last attempt to quit: 03/10/2007    Years since quitting: 11.1  . Smokeless tobacco: Never Used  Substance Use Topics  . Alcohol use: Not Currently  . Drug use: No    Review of Systems Per HPI unless specifically indicated above     Objective:    There were no vitals taken for this visit.  Wt Readings from Last 3 Encounters:  03/20/18 149 lb 9.6 oz (67.9 kg)  04/23/16 196 lb (88.9 kg)  01/12/16 182 lb (82.6 kg)    Physical Exam Patient remotely monitored. Patient is deaf, so typing in chat feature of [email or web address] and  video are utilized.  Patient verbally responds and has appropriate verbal communication.  Cognition normal.   Results for orders placed or performed in visit on 03/20/18  TSH  Result Value Ref Range   TSH 0.465 0.450 - 4.500 uIU/mL  Hemoglobin A1c  Result Value Ref Range   Hgb A1c MFr Bld 5.0 4.8 - 5.6 %   Est. average glucose Bld gHb Est-mCnc 97 mg/dL      Assessment & Plan:   Problem List Items Addressed This Visit    None    Visit Diagnoses    Acute gastritis without hemorrhage, unspecified gastritis type    -  Primary   Relevant Medications   famotidine (PEPCID) 20 MG tablet     Most likely is acute gastritis given some passing nausea with symptoms.  Cannot exclude colitis, cholecystitis, or pancreatitis.  Cholecystitis and pancreatitis are less likely given associated symptoms and no sharp abdominal pain/changes with meals.  Plan: 1. Treat with short term course of H2 blocker for gastritis symptoms.  Take famotidine 20 mg bid x 14 days. 2. Continue monitoring symptoms.  Discussed all ddx above and patient will continue monitoring for changes in symptoms. Discussed most possible causes will resolve spontaneously. 3. Follow-up prn 10-14 days if not improving.  Meds ordered this encounter  Medications  . famotidine (PEPCID) 20 MG tablet    Sig: Take 1 tablet (20 mg total) by mouth 2 (two) times  daily for 14 days.    Dispense:  30 tablet    Refill:  0    Order Specific Question:   Supervising Provider    Answer:   Olin Hauser [[id number]]    - Time spent in direct consultation with patient via telemedicine about above concerns: 14 minutes  Follow up plan: Return 7-10 days if symptoms worsen or fail to improve.  Cassell Smiles, DNP, AGPCNP-BC Adult Gerontology Primary Care Nurse Practitioner [facility] 05/06/2018, 2:04 PM

## 2018-05-13 ENCOUNTER — Other Ambulatory Visit: Payer: Self-pay | Admitting: Nurse Practitioner

## 2018-05-13 DIAGNOSIS — K2900 Acute gastritis without bleeding: Secondary | ICD-10-CM

## 2018-05-28 ENCOUNTER — Encounter: Payer: Self-pay | Admitting: Nurse Practitioner

## 2018-05-28 DIAGNOSIS — R1013 Epigastric pain: Secondary | ICD-10-CM

## 2018-05-28 DIAGNOSIS — K219 Gastro-esophageal reflux disease without esophagitis: Secondary | ICD-10-CM

## 2018-05-28 NOTE — Addendum Note (Signed)
Addended by: Olin Hauser on: 05/28/2018 06:06 PM   Modules accepted: Orders

## 2018-05-29 ENCOUNTER — Other Ambulatory Visit: Payer: Self-pay

## 2018-05-29 DIAGNOSIS — K219 Gastro-esophageal reflux disease without esophagitis: Secondary | ICD-10-CM

## 2018-05-29 DIAGNOSIS — R1013 Epigastric pain: Secondary | ICD-10-CM

## 2018-05-30 ENCOUNTER — Other Ambulatory Visit: Payer: Self-pay

## 2018-05-30 ENCOUNTER — Encounter: Payer: Self-pay | Admitting: Family Medicine

## 2018-05-30 ENCOUNTER — Ambulatory Visit (INDEPENDENT_AMBULATORY_CARE_PROVIDER_SITE_OTHER): Payer: BLUE CROSS/BLUE SHIELD | Admitting: Family Medicine

## 2018-05-30 DIAGNOSIS — K219 Gastro-esophageal reflux disease without esophagitis: Secondary | ICD-10-CM | POA: Diagnosis not present

## 2018-05-30 DIAGNOSIS — R1013 Epigastric pain: Secondary | ICD-10-CM

## 2018-05-30 LAB — CBC WITH DIFFERENTIAL/PLATELET
Absolute Monocytes: 446 cells/uL (ref 200–950)
Basophils Absolute: 32 cells/uL (ref 0–200)
Basophils Relative: 0.7 %
Eosinophils Absolute: 18 cells/uL (ref 15–500)
Eosinophils Relative: 0.4 %
HCT: 43.6 % (ref 35.0–45.0)
Hemoglobin: 14.9 g/dL (ref 11.7–15.5)
Lymphs Abs: 1526 cells/uL (ref 850–3900)
MCH: 32 pg (ref 27.0–33.0)
MCHC: 34.2 g/dL (ref 32.0–36.0)
MCV: 93.6 fL (ref 80.0–100.0)
MPV: 9.2 fL (ref 7.5–12.5)
Monocytes Relative: 9.9 %
Neutro Abs: 2480 cells/uL (ref 1500–7800)
Neutrophils Relative %: 55.1 %
Platelets: 234 10*3/uL (ref 140–400)
RBC: 4.66 10*6/uL (ref 3.80–5.10)
RDW: 12.3 % (ref 11.0–15.0)
Total Lymphocyte: 33.9 %
WBC: 4.5 10*3/uL (ref 3.8–10.8)

## 2018-05-30 LAB — LIPASE: Lipase: 10 U/L (ref 7–60)

## 2018-05-30 LAB — COMPLETE METABOLIC PANEL WITH GFR
AG Ratio: 1.7 (calc) (ref 1.0–2.5)
ALT: 13 U/L (ref 6–29)
AST: 14 U/L (ref 10–30)
Albumin: 4.2 g/dL (ref 3.6–5.1)
Alkaline phosphatase (APISO): 61 U/L (ref 31–125)
BUN: 12 mg/dL (ref 7–25)
CO2: 29 mmol/L (ref 20–32)
Calcium: 9.1 mg/dL (ref 8.6–10.2)
Chloride: 103 mmol/L (ref 98–110)
Creat: 0.81 mg/dL (ref 0.50–1.10)
GFR, Est African American: 105 mL/min/{1.73_m2} (ref >=60)
GFR, Est Non African American: 90 mL/min/{1.73_m2} (ref >=60)
Globulin: 2.5 g/dL (calc) (ref 1.9–3.7)
Glucose, Bld: 87 mg/dL (ref 65–99)
Potassium: 4.1 mmol/L (ref 3.5–5.3)
Sodium: 138 mmol/L (ref 135–146)
Total Bilirubin: 1 mg/dL (ref 0.2–1.2)
Total Protein: 6.7 g/dL (ref 6.1–8.1)

## 2018-05-30 LAB — H. PYLORI BREATH TEST: H. pylori Breath Test: NOT DETECTED

## 2018-05-30 MED ORDER — OMEPRAZOLE 40 MG PO CPDR: 40.0000 mg | DELAYED_RELEASE_CAPSULE | Freq: Every day | ORAL | 1 refills | Status: DC

## 2018-05-30 MED ORDER — SUCRALFATE 1 G PO TABS: 1.0000 g | ORAL_TABLET | Freq: Three times a day (TID) | ORAL | 0 refills | Status: DC

## 2018-05-30 NOTE — Patient Instructions (Addendum)
If this is a muscle strain that will heal on it's own. Let me know in 4 weeks if we are improving.  I am not sure the exact cause of your abdominal pain, however I am concerned that one significant possibility could be uncontrolled Acid Reflux (GERD) and may have developed an Ulcer (Peptic Ulcer of stomach).  Since you have completed your H Pylori Breath Test today, we can start with the prescribed medicines, and we will notify you of your result and if we have to change treatment.  IF BREATH TEST = NEGATIVE (or waiting for results): Starting today BEFORE next meal take Omeprazole 40mg  daily. Prefer to take this med about 30 min before breakfast or 1st meal of day for 4 weeks, don't stop taking unless we discuss first. Probably will need for about 8 weeks total if this ends up being an Ulcer.  Normally this medicine should be taken [place] on EMPTY STOMACH.  Take other prescribed medicine Carafate (Sucralfate) as needed up to 4 times daily (3 meals and bedtime)  to coat stomach lining to ease symptoms, if it helps reduce symptoms then it is more likely to be due to acid and/or ulcer.  IF BREATH TEST = POSITIVE (we notified you of this result): - CHANGE Omepraozle dose from 40mg  daily to 40mg  (one pill) TWICE daily, about 30 min before breakfast and dinner - FOR TWO WEEKS, then resume DAILY treatment - Also we will send in TWO antibiotics to take IN ADDITION: - Amoxicillin 1g TWICE daily / Clarithromycin 500mg  TWICE daily - both for 2 weeks  DIET RECOMMENDATIONS - Avoid spicy, greasy, fried foods, also things like caffeine, dark chocolate, peppermint can worsen - Avoid large meals and late night snacks, also do not go more than 4-5 hours without a snack or meal (not eating will worsen reflux symptoms due to stomach acid) - You may also elevate the head of your bed at night to sleep at very slight incline to help reduce symptoms  If the problem improves but  keeps coming back, we can discuss higher dose or longer course at next visit.  If symptoms are worsening or persistent despite treatment or develop any different severe esophagus or abdominal pain, unable to swallow solids or liquids, nausea, vomiting especially blood in vomit, fever/chills, or unintentional weight loss / no appetite, please follow-up sooner in office or seek more immediate medical attention at hospital Emergency Department.  Regarding other medicines:  - STOP taking Ibuprofen, Advil, Motrin, Goody's / BC powder - DO NOT take without discussing with your doctor. These medicines can put you at high risk for future bleeding.  If need pain medicine, may take Tylenol Extra Strength (Acetaminophen) 500mg  tabs - take 1 to 2 tabs per dose (max 1000mg ) every 6-8 hours for pain (take regularly, don't skip a dose for next 7 days), max 24 hour daily dose is 6 tablets or 3000mg . In the future you can repeat the same everyday Tylenol course for 1-2 weeks at a time.   Please schedule a Follow-up Appointment to: Return in about 4 weeks (around 06/27/2018), or if symptoms worsen or fail to improve, for GERD / abdominal pain.  If you have any other questions or concerns, please feel free to call the office or send a message through [place]. You may also schedule an earlier appointment if necessary.  Additionally, you may be receiving a survey about your experience at our office within a few days to  1 week by e-mail or mail. We value your feedback.  Nobie Putnam, DO [place]

## 2018-05-30 NOTE — Progress Notes (Signed)
Subjective:    Patient ID: Vicki Walters, female    DOB: 04-13-1975, 43 y.o.   MRN: [id number]  Vicki Walters is a 43 y.o. female presenting on 05/30/2018 for Abdominal Pain (intermittent stomach pain. Pt notice improvement since last visit.)  Virtual / Telehealth Encounter - Video Visit via [email or web address] The purpose of this virtual visit is to provide medical care while limiting exposure to the novel coronavirus (COVID19) for both patient and office staff.  Consent was obtained for remote visit:  Yes.   Answered questions that patient had about telehealth interaction:  Yes.   I discussed the limitations, risks, security and privacy concerns of performing an evaluation and management service by video/telephone. I also discussed with the patient that there may be a patient responsible charge related to this service. The patient expressed understanding and agreed to proceed.  Patient Location: Home Provider Location: [facility] (Office)  PCP is Cassell Smiles, AGPCNP-BC - I am currently covering during her maternity leave.   Note patient is essentially deaf - unable to hear via remote / video. Required input from me by typing on the chat feature for [email or web address] today, she is able to verbalize her responses and also type to me.  HPI   ABDOMINAL PAIN / Gastritis vs GERD - Last visit with PCP Lauren on 05/06/18, for initial visit for same problem thought it was due to GERD gastritis at that time, she had some bloating as well, remote history of acid reflux but not with current heartburn, some bowel changes, treated with Famotidine trial for 2 weeks, see prior notes for background information. - Interval update with improved on Famotidine, then pain returned after finished Famotidine, she has contacted [place] back on MyChart on 05/28/18 for this problem, requesting further advice and testing, she had labs done before this apt and had h pylori breath test as well  - Today patient reports  abdominal pain is improved, some day are better, other day still has some symptoms. She does not endorse worsening with eating / drinking or meals. She did better on the medicine, and is asking about treatment options - Rarely takes Motrin PRN, not regular dosing - Additionally she describes concern of possible abdominal wall muscle strain from sit ups she forgot to mention previously, she does run many miles per week as well, asking if she could have strained a muscle - She admits some headache at times as well, that has improved Admits some pain radiates to back occasionally - Denies prior history of ulcer or similar issue - Denies dark stool or rectal bleeding, nausea vomiting, fever chills sweats    Depression screen [facility] 2/9 03/20/2018 03/10/2015  Decreased Interest 0 0  Down, Depressed, Hopeless 0 0  PHQ - 2 Score 0 0    Social History   Tobacco Use  . Smoking status: Former Smoker    Packs/day: 0.50    Years: 10.00    Pack years: 5.00    Last attempt to quit: 03/10/2007    Years since quitting: 11.2  . Smokeless tobacco: Never Used  Substance Use Topics  . Alcohol use: Not Currently  . Drug use: No    Review of Systems Per HPI unless specifically indicated above     Objective:    There were no vitals taken for this visit.  Wt Readings from Last 3 Encounters:  03/20/18 149 lb 9.6 oz (67.9 kg)  04/23/16 196 lb (88.9 kg)  01/12/16 182 lb (82.6 kg)  Physical Exam   Note examination was completely remotely via video observation objective data only  Gen - well-appearing, no acute distress or apparent pain, comfortable HEENT - eyes appear clear without discharge or redness Heart/Lungs - cannot examine virtually - observed no evidence of coughing or labored breathing. Abd - cannot examine virtually  Skin - face visible today- no rash Neuro - awake, alert, oriented Psych - not anxious appearing   Results for orders placed or performed in visit on 05/29/18  CBC  with Differential/Platelet  Result Value Ref Range   WBC 4.5 3.8 - 10.8 Thousand/uL   RBC 4.66 3.80 - 5.10 Million/uL   Hemoglobin 14.9 11.7 - 15.5 g/dL   HCT 43.6 35.0 - 45.0 %   MCV 93.6 80.0 - 100.0 fL   MCH 32.0 27.0 - 33.0 pg   MCHC 34.2 32.0 - 36.0 g/dL   RDW 12.3 11.0 - 15.0 %   Platelets 234 140 - 400 Thousand/uL   MPV 9.2 7.5 - 12.5 fL   Neutro Abs 2,480 1,500 - 7,800 cells/uL   Lymphs Abs 1,526 850 - 3,900 cells/uL   Absolute Monocytes 446 200 - 950 cells/uL   Eosinophils Absolute 18 15 - 500 cells/uL   Basophils Absolute 32 0 - 200 cells/uL   Neutrophils Relative % 55.1 %   Total Lymphocyte 33.9 %   Monocytes Relative 9.9 %   Eosinophils Relative 0.4 %   Basophils Relative 0.7 %  COMPLETE METABOLIC PANEL WITH GFR  Result Value Ref Range   Glucose, Bld 87 65 - 99 mg/dL   BUN 12 7 - 25 mg/dL   Creat 0.81 0.50 - 1.10 mg/dL   GFR, Est Non African American 90 > OR = 60 mL/min/1.37m2   GFR, Est African American 105 > OR = 60 mL/min/1.110m2   BUN/Creatinine Ratio NOT APPLICABLE 6 - 22 (calc)   Sodium 138 135 - 146 mmol/L   Potassium 4.1 3.5 - 5.3 mmol/L   Chloride 103 98 - 110 mmol/L   CO2 29 20 - 32 mmol/L   Calcium 9.1 8.6 - 10.2 mg/dL   Total Protein 6.7 6.1 - 8.1 g/dL   Albumin 4.2 3.6 - 5.1 g/dL   Globulin 2.5 1.9 - 3.7 g/dL (calc)   AG Ratio 1.7 1.0 - 2.5 (calc)   Total Bilirubin 1.0 0.2 - 1.2 mg/dL   Alkaline phosphatase (APISO) 61 31 - 125 U/L   AST 14 10 - 30 U/L   ALT 13 6 - 29 U/L  H. pylori breath test  Result Value Ref Range   H. pylori Breath Test NOT DETECTED NOT DETECT  Lipase  Result Value Ref Range   Lipase 10 7 - 60 U/L      Assessment & Plan:   Problem List Items Addressed This Visit    GERD (gastroesophageal reflux disease)   Relevant Medications   omeprazole (PRILOSEC) 40 MG capsule   sucralfate (CARAFATE) 1 g tablet    Other Visit Diagnoses    Epigastric abdominal pain    -  Primary   Relevant Medications   omeprazole (PRILOSEC)  40 MG capsule   sucralfate (CARAFATE) 1 g tablet      Suspect possible gastritis vs possible PUD given history and symptoms, prior GERD and recent improvement on H2 blocker. - Diff dx also includes possibly abdominal muscle/wall strain from sit ups and running possible  No other significant GI red flags (no unintentional wt loss, night-sweats, refractory abdominal pain n/v, hematemesis or  melena).  Plan: - Reviewed results, labs normal chemistry, CBC, Lipase - also pending H pyori breath test 1. Start prolonged PPI trial with Omeprazole 40mg  daily for 4 weeks, anticipate will need up to total 8 weeks 2. Start carafate 1g TID WC + QHS PRN for symptom relief 3. Strict return criteria given for worsening symptoms 4. Follow-up sooner if no improvement, otherwise within 4 weeks if gradually improving  NOTE after visit by 4pm today H Pylori resulted NOT DETECTED on breath test, sent her results on mychart. No antibiotics needed    Meds ordered this encounter  Medications  . omeprazole (PRILOSEC) 40 MG capsule    Sig: Take 1 capsule (40 mg total) by mouth daily before breakfast.    Dispense:  30 capsule    Refill:  1  . sucralfate (CARAFATE) 1 g tablet    Sig: Take 1 tablet (1 g total) by mouth 4 (four) times daily -  with meals and at bedtime. As needed only    Dispense:  30 tablet    Refill:  0     Follow-up: - Return in 4 weeks if not improved  Patient verbalizes understanding with the above medical recommendations including the limitation of remote medical advice.  Specific follow-up and call-back criteria were given for patient to follow-up or seek medical care more urgently if needed.  Total duration of direct patient care provided via video conference: 30 minutes   Nobie Putnam, [facility] 05/30/2018, 11:08 AM

## 2018-06-21 ENCOUNTER — Other Ambulatory Visit: Payer: Self-pay | Admitting: Family Medicine

## 2018-06-21 DIAGNOSIS — K219 Gastro-esophageal reflux disease without esophagitis: Secondary | ICD-10-CM

## 2018-06-21 DIAGNOSIS — R1013 Epigastric pain: Secondary | ICD-10-CM

## 2018-06-26 ENCOUNTER — Telehealth: Payer: Self-pay

## 2018-06-26 NOTE — Telephone Encounter (Signed)
I attempted to contact the pt to f/u on her appt that she scheduled for Monday. The Appt was scheduled for chest and stomach pains.  I wanted to triage to rule out emergency. I contacted  both numbers in the patient chart. No answer, LMOM.

## 2018-06-27 ENCOUNTER — Telehealth: Payer: Self-pay

## 2018-06-27 NOTE — Telephone Encounter (Signed)
The pt husband called to reschedule his wife appt to a face to face on 6/15. I informed the husband that I attempted to call him on yesterday to discuss his wife symptoms. The pt is hearing impaired so I called the husband, no answer I left message on the voicemail for someone to return my call. He stated that he didn't feel like it was related to her heart, but unable to give me any details concerning the pain. We agreed that send a mychart message with all my questions was best to the patient. The message was sent. Pending response.

## 2018-06-30 ENCOUNTER — Ambulatory Visit: Payer: BLUE CROSS/BLUE SHIELD | Admitting: Family Medicine

## 2018-07-07 ENCOUNTER — Ambulatory Visit: Payer: BC Managed Care – PPO | Admitting: Family Medicine

## 2018-07-07 ENCOUNTER — Other Ambulatory Visit: Payer: Self-pay | Admitting: Family Medicine

## 2018-07-07 DIAGNOSIS — R1013 Epigastric pain: Secondary | ICD-10-CM

## 2018-07-07 DIAGNOSIS — K219 Gastro-esophageal reflux disease without esophagitis: Secondary | ICD-10-CM

## 2018-07-07 MED ORDER — OMEPRAZOLE 40 MG PO CPDR: 40.0000 mg | DELAYED_RELEASE_CAPSULE | Freq: Every day | ORAL | 1 refills | Status: DC

## 2018-07-07 NOTE — Addendum Note (Signed)
Addended by: Olin Hauser on: 07/07/2018 08:59 AM   Modules accepted: Orders

## 2018-08-13 ENCOUNTER — Encounter: Payer: Self-pay | Admitting: Nurse Practitioner

## 2018-08-13 ENCOUNTER — Other Ambulatory Visit: Payer: Self-pay

## 2018-08-13 ENCOUNTER — Ambulatory Visit (INDEPENDENT_AMBULATORY_CARE_PROVIDER_SITE_OTHER): Payer: BC Managed Care – PPO | Admitting: Nurse Practitioner

## 2018-08-13 VITALS — BP 112/66 | HR 79 | Ht 65.0 in | Wt 141.0 lb

## 2018-08-13 DIAGNOSIS — R1013 Epigastric pain: Secondary | ICD-10-CM | POA: Diagnosis not present

## 2018-08-13 DIAGNOSIS — K219 Gastro-esophageal reflux disease without esophagitis: Secondary | ICD-10-CM | POA: Diagnosis not present

## 2018-08-13 DIAGNOSIS — R0789 Other chest pain: Secondary | ICD-10-CM

## 2018-08-13 MED ORDER — OMEPRAZOLE 40 MG PO CPDR: 40.0000 mg | DELAYED_RELEASE_CAPSULE | Freq: Every day | ORAL | 1 refills | Status: DC

## 2018-08-13 NOTE — Progress Notes (Signed)
Subjective:    Patient ID: Vicki Walters, female    DOB: [birth year], 43 y.o.   MRN: [id number]  Vicki Walters is a 43 y.o. female presenting on 08/13/2018 for Chest Pain (intermittent pt describe it as achy muscle pain. The pain improve with Tyelnol )  HPI Substernal chest pain Patient describes pain as aching, "inflamed" pain.  Patient now states the inflamed pain is resolved.  Pain is partially relieved with Tylenol.  Pain continues to be intermittent and not changed with positions or meals. Patient notes no pattern to the increase in pain.  Chest pain started about 6 weeks ago. - After taking omeprazole as advised by Dr Parks Ranger on 05/30/2018 for epigastric abdominal pain, Patient notes her pain improved but resumed again about 1 week ago after stopping omeprazole.  Also continues to feel abdominal muscle pain/pulled muscle, but this is improving.  Epigastric pain is improving. - Patient states she also felt sharp pain when moving furniture about 1 week ago for a few seconds, but that she was experiencing the pain prior.  Social History   Tobacco Use  . Smoking status: Former Smoker    Packs/day: 0.50    Years: 10.00    Pack years: 5.00    Quit date: 03/10/2007    Years since quitting: 11.4  . Smokeless tobacco: Never Used  Substance Use Topics  . Alcohol use: Not Currently  . Drug use: No    Review of Systems Per HPI unless specifically indicated above.     Objective:    BP 112/66 (BP Location: Left Arm, Patient Position: Sitting, Cuff Size: Normal)   Pulse 79   Ht 5\' 5"  (1.651 m)   Wt 141 lb (64 kg)   BMI 23.46 kg/m   Wt Readings from Last 3 Encounters:  08/13/18 141 lb (64 kg)  03/20/18 149 lb 9.6 oz (67.9 kg)  04/23/16 196 lb (88.9 kg)    Physical Exam Constitutional:      Appearance: She is normal weight.  HENT:     Head: Normocephalic and atraumatic.  Eyes:     Pupils: Pupils are equal, round, and reactive to light.  Cardiovascular:     Rate and  Rhythm: Normal rate and regular rhythm.     Pulses:          Carotid pulses are 2+ on the right side and 2+ on the left side.      Radial pulses are 2+ on the right side and 2+ on the left side.       Posterior tibial pulses are 2+ on the right side and 2+ on the left side.     Heart sounds: Normal heart sounds. No murmur. No friction rub. No gallop.   Pulmonary:     Effort: Pulmonary effort is normal. No accessory muscle usage.     Breath sounds: Normal breath sounds.  Abdominal:     General: Bowel sounds are normal.     Palpations: Abdomen is soft. There is no mass.     Tenderness: There is no abdominal tenderness. There is no guarding or rebound.  Musculoskeletal:     Comments: Sternum Inspection: normal appearance without ecchymosis, erythema, or trauma. Palpation: no tenderness with palpation ROM: Normal ROM of shoulders without pain Special Testing: no change in pain with ROM or with resistance of arms Strength: 5/5 Neurovascular: normal cap refill  Neurological:     Mental Status: She is alert.    08/13/18 EKG: Normal Sinus Rhythm  QRS:  80     HR 72bpm PR 177ms   QT 42ms   QTc 430ms   Results for orders placed or performed in visit on 05/29/18  CBC with Differential/Platelet  Result Value Ref Range   WBC 4.5 3.8 - 10.8 Thousand/uL   RBC 4.66 3.80 - 5.10 Million/uL   Hemoglobin 14.9 11.7 - 15.5 g/dL   HCT 43.6 35.0 - 45.0 %   MCV 93.6 80.0 - 100.0 fL   MCH 32.0 27.0 - 33.0 pg   MCHC 34.2 32.0 - 36.0 g/dL   RDW 12.3 11.0 - 15.0 %   Platelets 234 140 - 400 Thousand/uL   MPV 9.2 7.5 - 12.5 fL   Neutro Abs 2,480 1,500 - 7,800 cells/uL   Lymphs Abs 1,526 850 - 3,900 cells/uL   Absolute Monocytes 446 200 - 950 cells/uL   Eosinophils Absolute 18 15 - 500 cells/uL   Basophils Absolute 32 0 - 200 cells/uL   Neutrophils Relative % 55.1 %   Total Lymphocyte 33.9 %   Monocytes Relative 9.9 %   Eosinophils Relative 0.4 %   Basophils Relative 0.7 %  COMPLETE METABOLIC PANEL  WITH GFR  Result Value Ref Range   Glucose, Bld 87 65 - 99 mg/dL   BUN 12 7 - 25 mg/dL   Creat 0.81 0.50 - 1.10 mg/dL   GFR, Est Non African American 90 > OR = 60 mL/min/1.67m2   GFR, Est African American 105 > OR = 60 mL/min/1.61m2   BUN/Creatinine Ratio NOT APPLICABLE 6 - 22 (calc)   Sodium 138 135 - 146 mmol/L   Potassium 4.1 3.5 - 5.3 mmol/L   Chloride 103 98 - 110 mmol/L   CO2 29 20 - 32 mmol/L   Calcium 9.1 8.6 - 10.2 mg/dL   Total Protein 6.7 6.1 - 8.1 g/dL   Albumin 4.2 3.6 - 5.1 g/dL   Globulin 2.5 1.9 - 3.7 g/dL (calc)   AG Ratio 1.7 1.0 - 2.5 (calc)   Total Bilirubin 1.0 0.2 - 1.2 mg/dL   Alkaline phosphatase (APISO) 61 31 - 125 U/L   AST 14 10 - 30 U/L   ALT 13 6 - 29 U/L  H. pylori breath test  Result Value Ref Range   H. pylori Breath Test NOT DETECTED NOT DETECT  Lipase  Result Value Ref Range   Lipase 10 7 - 60 U/L      Assessment & Plan:   Problem List Items Addressed This Visit      Digestive   GERD (gastroesophageal reflux disease)   Relevant Medications   omeprazole (PRILOSEC) 40 MG capsule    Other Visit Diagnoses    Other chest pain    -  Primary   Relevant Orders   EKG 12-Lead   Epigastric abdominal pain       Relevant Medications   omeprazole (PRILOSEC) 40 MG capsule      Patient with non-specific chest pain without pattern associated with exercise/rest or food intake.  Differential diagnoses included GERD, costochondritis, muscle strain, myocardial infarction, pleuritic chest pain.  EKG is normal which suggests pain is non-cardiac.  History of resolved pain when on omeprazole is most suggestive of GERD.  Plan: 1. Resume and continue omeprazole 40 mg once daily.  May reduce dose in 3-6 months if symptoms have resolved. 2. Encouraged patient to contact clinic if omeprazole does not help pain within next 2 weeks.  May need to take NSAIDs for musculoskeletal injury vs obtain Xray for  costochondritis. 3. Follow-up prn in clinic vs video visit in  2-4 weeks  Meds ordered this encounter  Medications  . omeprazole (PRILOSEC) 40 MG capsule    Sig: Take 1 capsule (40 mg total) by mouth daily before breakfast.    Dispense:  90 capsule    Refill:  1    Order Specific Question:   Supervising Provider    Answer:   Olin Hauser [[id number]]   Follow up plan: Return 2-4 weeks if symptoms worsen or fail to improve.  Cassell Smiles, DNP, AGPCNP-BC Adult Gerontology Primary Care Nurse Practitioner [facility] 08/13/2018, 11:12 AM

## 2018-08-13 NOTE — Patient Instructions (Addendum)
Vicki Walters,   Thank you for coming in to clinic today.  1. So your chest pain is likely from acid reflux (heartburn) or costochondritis (more consistent with feeling inflamed).  It is hard to know specifically which is causing it due to the timing of stopping your omeprazole and your moving furniture. - Your EKG is normal, so your pain is not likely from your heart. - This is probably reflux causing your chest pain since it started after you stopped the omeprazole.  Let's restart the omeprazole and continue it for 3-6 months. **Take omeprazole 40 mg one tablet daily about 30 minutes before breakfast.  Refills are sent to your pharmacy. - If the pain continues 2 weeks after resuming the omeprazole, we would recommend treating costochondritis  with 2 weeks of anti-inflammatories.  Call back to the clinic or send a Mychart message to let me know about how you are doing if it is not getting better.  Please schedule a follow-up appointment with Cassell Smiles, AGNP. Return 2-4 weeks if symptoms worsen or fail to improve.  If you have any other questions or concerns, please feel free to call the clinic or send a message through [place]. You may also schedule an earlier appointment if necessary.  You will receive a survey after today's visit either digitally by e-mail or paper by [employer]. Your experiences and feedback matter to [place].  Please respond so we know how we are doing as we provide care for you.   Cassell Smiles, DNP, AGNP-BC Adult Gerontology Nurse Practitioner [place]

## 2018-08-16 ENCOUNTER — Encounter: Payer: Self-pay | Admitting: Nurse Practitioner

## 2018-08-21 ENCOUNTER — Encounter: Payer: Self-pay | Admitting: Nurse Practitioner

## 2018-09-25 ENCOUNTER — Encounter: Payer: Self-pay | Admitting: Nurse Practitioner

## 2018-10-01 ENCOUNTER — Other Ambulatory Visit (HOSPITAL_COMMUNITY)
Admission: RE | Admit: 2018-10-01 | Discharge: 2018-10-01 | Disposition: A | Discharge status: Home / Self Care | Payer: BC Managed Care – PPO | Source: Ambulatory Visit | Attending: Nurse Practitioner | Admitting: Nurse Practitioner

## 2018-10-01 ENCOUNTER — Encounter: Payer: Self-pay | Admitting: Nurse Practitioner

## 2018-10-01 ENCOUNTER — Ambulatory Visit (INDEPENDENT_AMBULATORY_CARE_PROVIDER_SITE_OTHER): Payer: BC Managed Care – PPO | Admitting: Nurse Practitioner

## 2018-10-01 ENCOUNTER — Other Ambulatory Visit: Payer: Self-pay

## 2018-10-01 VITALS — BP 121/79 | HR 70 | Ht 65.0 in | Wt 144.0 lb

## 2018-10-01 DIAGNOSIS — Z1329 Encounter for screening for other suspected endocrine disorder: Secondary | ICD-10-CM

## 2018-10-01 DIAGNOSIS — Z30011 Encounter for initial prescription of contraceptive pills: Secondary | ICD-10-CM

## 2018-10-01 DIAGNOSIS — Z124 Encounter for screening for malignant neoplasm of cervix: Secondary | ICD-10-CM | POA: Diagnosis not present

## 2018-10-01 DIAGNOSIS — Z131 Encounter for screening for diabetes mellitus: Secondary | ICD-10-CM | POA: Diagnosis not present

## 2018-10-01 DIAGNOSIS — Z0000 Encounter for general adult medical examination without abnormal findings: Secondary | ICD-10-CM | POA: Diagnosis not present

## 2018-10-01 MED ORDER — NORETHINDRONE 0.35 MG PO TABS: 1.0000 | ORAL_TABLET | Freq: Every day | ORAL | 11 refills | Status: DC

## 2018-10-01 NOTE — Progress Notes (Signed)
Subjective:    Patient ID: Vicki Walters, female    DOB: [birth year], 43 y.o.   MRN: [id number]  Vicki Walters is a 43 y.o. female presenting on 10/01/2018 for Annual Exam (intermittent lower and upper abdominal pain. Pt state that he last episode lasted about 13 days and subsided x 4 days ago.)  HPI Annual Physical Exam Patient has been feeling pretty well.  They have no acute concerns today. Sleeps 7 hours per night uninterrupted.  HEALTH MAINTENANCE: Weight/BMI: healthy Physical activity: regular - 4 times per week Diet: regular, generally health ywith vegetables and fri=uits Seatbelt: always Sunscreen: usually if prolonged exposure (not always with running) PAP: due today Mammogram: defers today due to breastfeeding - plans to continue until next 6-12 months.  We will readdress next year.   HIV: neg with pregnancy Optometry: every 1-2 years for eye exam Dentistry: regular cleanings  VACCINES: Tetanus: received in pregnancy - 2018 Influenza: declines  Past Medical History:  Diagnosis Date  . GERD (gastroesophageal reflux disease)   . Thyroid disease    No past surgical history on file. Social History   Socioeconomic History  . Marital status: Married    Spouse name: Not on file  . Number of children: Not on file  . Years of education: Not on file  . Highest education level: Not on file  Occupational History  . Not on file  Social Needs  . Financial resource strain: Not on file  . Food insecurity    Worry: Not on file    Inability: Not on file  . Transportation needs    Medical: Not on file    Non-medical: Not on file  Tobacco Use  . Smoking status: Former Smoker    Packs/day: 0.50    Years: 10.00    Pack years: 5.00    Quit date: 03/10/2007    Years since quitting: 11.5  . Smokeless tobacco: Never Used  Substance and Sexual Activity  . Alcohol use: Yes    Comment: occasional   . Drug use: No  . Sexual activity: Yes  Lifestyle  . Physical activity     Days per week: Not on file    Minutes per session: Not on file  . Stress: Not on file  Relationships  . Social Herbalist on phone: Not on file    Gets together: Not on file    Attends religious service: Not on file    Active member of club or organization: Not on file    Attends meetings of clubs or organizations: Not on file    Relationship status: Not on file  . Intimate partner violence    Fear of current or ex partner: Not on file    Emotionally abused: Not on file    Physically abused: Not on file    Forced sexual activity: Not on file  Other Topics Concern  . Not on file  Social History Narrative  . Not on file   Family History  Problem Relation Age of Onset  . Heart disease Maternal Grandmother   . Cancer Maternal Grandfather   . Diabetes Paternal Grandfather   . Breast cancer Sister 53   Current Outpatient Medications on File Prior to Visit  Medication Sig  . Cholecalciferol (VITAMIN D) 125 MCG (5000 UT) CAPS Take by mouth.  Marland Kitchen omeprazole (PRILOSEC) 40 MG capsule Take 1 capsule (40 mg total) by mouth daily before breakfast.  . Prenat-Fe Carbonyl-FA-Omega 3 (ONE-A-DAY WOMENS PRENATAL 1)  28-0.8-235 MG CAPS Take by mouth.  . sucralfate (CARAFATE) 1 g tablet Take 1 tablet (1 g total) by mouth 4 (four) times daily -  with meals and at bedtime. As needed only   No current facility-administered medications on file prior to visit.     Review of Systems  Constitutional: Negative for chills and fever.  HENT: Positive for hearing loss (deaf). Negative for congestion and sore throat.   Eyes: Negative for pain.  Respiratory: Negative for cough, shortness of breath and wheezing.   Cardiovascular: Negative for chest pain, palpitations and leg swelling.  Gastrointestinal: Negative for abdominal pain (improving, returns intermittently), blood in stool, constipation, diarrhea, nausea and vomiting.  Endocrine: Negative for polydipsia.  Genitourinary: Negative for  dysuria, frequency, hematuria and urgency.  Musculoskeletal: Negative for back pain, myalgias and neck pain.  Skin: Negative.  Negative for rash.  Allergic/Immunologic: Negative for environmental allergies.  Neurological: Negative for dizziness, weakness and headaches.  Hematological: Does not bruise/bleed easily.  Psychiatric/Behavioral: Negative for dysphoric mood and suicidal ideas. The patient is not nervous/anxious.    Per HPI unless specifically indicated above     Objective:    BP 121/79 (BP Location: Left Arm, Patient Position: Sitting, Cuff Size: Normal)   Pulse 70   Ht 5\' 5"  (1.651 m)   Wt 144 lb (65.3 kg)   LMP 09/10/2018   Breastfeeding Yes   BMI 23.96 kg/m   Wt Readings from Last 3 Encounters:  10/01/18 144 lb (65.3 kg)  08/13/18 141 lb (64 kg)  03/20/18 149 lb 9.6 oz (67.9 kg)    Physical Exam Vitals signs and nursing note reviewed.  Constitutional:      General: She is not in acute distress.    Appearance: She is well-developed.  HENT:     Head: Normocephalic and atraumatic.     Right Ear: External ear normal.     Left Ear: External ear normal.     Nose: Nose normal.  Eyes:     Conjunctiva/sclera: Conjunctivae normal.     Pupils: Pupils are equal, round, and reactive to light.  Neck:     Musculoskeletal: Normal range of motion and neck supple.     Thyroid: No thyromegaly.     Vascular: No JVD.     Trachea: No tracheal deviation.  Cardiovascular:     Rate and Rhythm: Normal rate and regular rhythm.     Heart sounds: Normal heart sounds. No murmur. No friction rub. No gallop.   Pulmonary:     Effort: Pulmonary effort is normal. No respiratory distress.     Breath sounds: Normal breath sounds.  Chest:     Comments: Breast - Symmetric breasts, patient actively lactating - multiple lumps palpated, no nipple discharge, no skin changes or tenderness.   Abdominal:     General: Bowel sounds are normal. There is no distension.     Palpations: Abdomen is  soft.     Tenderness: There is no abdominal tenderness.  Genitourinary:    Comments: Normal external female genitalia without lesions or fusion. Vaginal canal without lesions. Normal appearing cervix without lesions or friability. Physiologic discharge on exam. Bimanual exam without adnexal masses, enlarged uterus, or cervical motion tenderness. Musculoskeletal: Normal range of motion.  Lymphadenopathy:     Cervical: No cervical adenopathy.  Skin:    General: Skin is warm and dry.  Neurological:     Mental Status: She is alert and oriented to person, place, and time.     Cranial  Nerves: No cranial nerve deficit.  Psychiatric:        Behavior: Behavior normal.        Thought Content: Thought content normal.        Judgment: Judgment normal.    Results for orders placed or performed in visit on 10/01/18  Hemoglobin A1c  Result Value Ref Range   Hgb A1c MFr Bld 5.3 <5.7 % of total Hgb   Mean Plasma Glucose 105 (calc)   eAG (mmol/L) 5.8 (calc)  TSH  Result Value Ref Range   TSH 0.58 mIU/L  Comprehensive metabolic panel  Result Value Ref Range   Glucose, Bld 95 65 - 99 mg/dL   BUN 16 7 - 25 mg/dL   Creat 0.91 0.50 - 1.10 mg/dL   BUN/Creatinine Ratio NOT APPLICABLE 6 - 22 (calc)   Sodium 140 135 - 146 mmol/L   Potassium 4.1 3.5 - 5.3 mmol/L   Chloride 104 98 - 110 mmol/L   CO2 27 20 - 32 mmol/L   Calcium 9.4 8.6 - 10.2 mg/dL   Total Protein 7.1 6.1 - 8.1 g/dL   Albumin 4.3 3.6 - 5.1 g/dL   Globulin 2.8 1.9 - 3.7 g/dL (calc)   AG Ratio 1.5 1.0 - 2.5 (calc)   Total Bilirubin 0.9 0.2 - 1.2 mg/dL   Alkaline phosphatase (APISO) 57 31 - 125 U/L   AST 16 10 - 30 U/L   ALT 14 6 - 29 U/L  CBC with Differential/Platelet  Result Value Ref Range   WBC 5.3 3.8 - 10.8 Thousand/uL   RBC 4.37 3.80 - 5.10 Million/uL   Hemoglobin 14.3 11.7 - 15.5 g/dL   HCT 42.6 35.0 - 45.0 %   MCV 97.5 80.0 - 100.0 fL   MCH 32.7 27.0 - 33.0 pg   MCHC 33.6 32.0 - 36.0 g/dL   RDW 12.6 11.0 - 15.0 %    Platelets 234 140 - 400 Thousand/uL   MPV 9.2 7.5 - 12.5 fL   Neutro Abs 3,657 1,500 - 7,800 cells/uL   Lymphs Abs 1,171 850 - 3,900 cells/uL   Absolute Monocytes 429 200 - 950 cells/uL   Eosinophils Absolute 11 (L) 15 - 500 cells/uL   Basophils Absolute 32 0 - 200 cells/uL   Neutrophils Relative % 69 %   Total Lymphocyte 22.1 %   Monocytes Relative 8.1 %   Eosinophils Relative 0.2 %   Basophils Relative 0.6 %  Cytology - PAP  Result Value Ref Range   Adequacy      Satisfactory for evaluation  endocervical/transformation zone component PRESENT.   Diagnosis      NEGATIVE FOR INTRAEPITHELIAL LESIONS OR MALIGNANCY.   HPV NOT Detected    Material Submitted CervicoVaginal Pap [ThinPrep Imaged]       Assessment & Plan:   Problem List Items Addressed This Visit    None    Visit Diagnoses    Encounter for annual physical exam    -  Primary   Relevant Orders   Hemoglobin A1c (Completed)   TSH (Completed)   Comprehensive metabolic panel (Completed)   CBC with Differential/Platelet (Completed)   Encounter for initial prescription of contraceptive pills       Relevant Medications   norethindrone (MICRONOR) 0.35 MG tablet   Encounter for Papanicolaou smear for cervical cancer screening       Relevant Orders   Cytology - PAP (Completed)   Diabetes mellitus screening       Relevant Orders   Hemoglobin  A1c (Completed)   Screening for endocrine disorder       Relevant Orders   TSH (Completed)    Annual physical exam with no new findings.  Well adult with no acute concerns.  Plan: 1. Obtain health maintenance screenings as above according to age. - Increase physical activity to 30 minutes most days of the week.  - Eat healthy diet high in vegetables and fruits; low in refined carbohydrates. - Screening labs and tests as ordered 2. Return 1 year for annual physical.     Meds ordered this encounter  Medications  . norethindrone (MICRONOR) 0.35 MG tablet    Sig: Take 1 tablet  (0.35 mg total) by mouth daily.    Dispense:  1 Package    Refill:  11    Order Specific Question:   Supervising Provider    Answer:   Olin Hauser [[id number]]    Follow up plan: Return in about 1 year (around 10/01/2019) for annual physical.  Cassell Smiles, DNP, AGPCNP-BC Adult Gerontology Primary Care Nurse Practitioner [facility] 10/01/2018, 9:35 AM

## 2018-10-01 NOTE — Patient Instructions (Addendum)
Vicki Walters,   Thank you for coming in to clinic today.  1. You need to be on progesterone only pills. - take norethindrone 0.35 mg daily  2. Labs today  3. Mammogram next year  4. Continue omeprazole.  Please schedule a follow-up appointment with Cassell Smiles, AGNP. Return in about 1 year (around 10/01/2019) for annual physical.  If you have any other questions or concerns, please feel free to call the clinic or send a message through [employer]. You may also schedule an earlier appointment if necessary.  You will receive a survey after today's visit either digitally by e-mail or paper by [employer]. Your experiences and feedback matter to [place].  Please respond so we know how we are doing as we provide care for you.   Cassell Smiles, DNP, AGNP-BC Adult Gerontology Nurse Practitioner [place]

## 2018-10-02 ENCOUNTER — Encounter: Payer: Self-pay | Admitting: Nurse Practitioner

## 2018-10-02 LAB — COMPREHENSIVE METABOLIC PANEL
AG Ratio: 1.5 (calc) (ref 1.0–2.5)
ALT: 14 U/L (ref 6–29)
AST: 16 U/L (ref 10–30)
Albumin: 4.3 g/dL (ref 3.6–5.1)
Alkaline phosphatase (APISO): 57 U/L (ref 31–125)
BUN: 16 mg/dL (ref 7–25)
CO2: 27 mmol/L (ref 20–32)
Calcium: 9.4 mg/dL (ref 8.6–10.2)
Chloride: 104 mmol/L (ref 98–110)
Creat: 0.91 mg/dL (ref 0.50–1.10)
Globulin: 2.8 g/dL (calc) (ref 1.9–3.7)
Glucose, Bld: 95 mg/dL (ref 65–99)
Potassium: 4.1 mmol/L (ref 3.5–5.3)
Sodium: 140 mmol/L (ref 135–146)
Total Bilirubin: 0.9 mg/dL (ref 0.2–1.2)
Total Protein: 7.1 g/dL (ref 6.1–8.1)

## 2018-10-02 LAB — CYTOLOGY - PAP
Diagnosis: NEGATIVE
HPV: NOT DETECTED

## 2018-10-02 LAB — CBC WITH DIFFERENTIAL/PLATELET
Absolute Monocytes: 429 cells/uL (ref 200–950)
Basophils Absolute: 32 cells/uL (ref 0–200)
Basophils Relative: 0.6 %
Eosinophils Absolute: 11 cells/uL — ABNORMAL LOW (ref 15–500)
Eosinophils Relative: 0.2 %
HCT: 42.6 % (ref 35.0–45.0)
Hemoglobin: 14.3 g/dL (ref 11.7–15.5)
Lymphs Abs: 1171 cells/uL (ref 850–3900)
MCH: 32.7 pg (ref 27.0–33.0)
MCHC: 33.6 g/dL (ref 32.0–36.0)
MCV: 97.5 fL (ref 80.0–100.0)
MPV: 9.2 fL (ref 7.5–12.5)
Monocytes Relative: 8.1 %
Neutro Abs: 3657 cells/uL (ref 1500–7800)
Neutrophils Relative %: 69 %
Platelets: 234 10*3/uL (ref 140–400)
RBC: 4.37 10*6/uL (ref 3.80–5.10)
RDW: 12.6 % (ref 11.0–15.0)
Total Lymphocyte: 22.1 %
WBC: 5.3 10*3/uL (ref 3.8–10.8)

## 2018-10-02 LAB — TSH: TSH: 0.58 mIU/L

## 2018-10-02 LAB — HEMOGLOBIN A1C
Hgb A1c MFr Bld: 5.3 % of total Hgb (ref <5.7)
Mean Plasma Glucose: 105 (calc)
eAG (mmol/L): 5.8 (calc)

## 2018-10-03 ENCOUNTER — Encounter: Payer: Self-pay | Admitting: Nurse Practitioner

## 2018-10-13 ENCOUNTER — Encounter: Payer: Self-pay | Admitting: Nurse Practitioner

## 2018-10-22 ENCOUNTER — Ambulatory Visit (INDEPENDENT_AMBULATORY_CARE_PROVIDER_SITE_OTHER): Payer: BC Managed Care – PPO | Admitting: Nurse Practitioner

## 2018-10-22 ENCOUNTER — Other Ambulatory Visit: Payer: Self-pay

## 2018-10-22 ENCOUNTER — Encounter: Payer: Self-pay | Admitting: Nurse Practitioner

## 2018-10-22 VITALS — BP 111/63 | HR 77 | Ht 65.0 in | Wt 146.2 lb

## 2018-10-22 DIAGNOSIS — R002 Palpitations: Secondary | ICD-10-CM | POA: Diagnosis not present

## 2018-10-22 DIAGNOSIS — E782 Mixed hyperlipidemia: Secondary | ICD-10-CM | POA: Diagnosis not present

## 2018-10-22 DIAGNOSIS — R0600 Dyspnea, unspecified: Secondary | ICD-10-CM

## 2018-10-22 DIAGNOSIS — R0609 Other forms of dyspnea: Secondary | ICD-10-CM | POA: Diagnosis not present

## 2018-10-22 DIAGNOSIS — R0789 Other chest pain: Secondary | ICD-10-CM | POA: Diagnosis not present

## 2018-10-22 MED ORDER — ALBUTEROL SULFATE HFA 108 (90 BASE) MCG/ACT IN AERS: 1.0000 | INHALATION_SPRAY | Freq: Four times a day (QID) | RESPIRATORY_TRACT | 0 refills | Status: DC | PRN

## 2018-10-22 NOTE — Progress Notes (Signed)
Subjective:    Patient ID: Vicki Walters, female    DOB: 28-Aug-1975, 43 y.o.   MRN: [id number]  Vicki Walters is a 43 y.o. female presenting on 10/22/2018 for Palpitations (pt complains intermittent heart palpation that you feel in her throat. Mostly with running, but it have had the palpitations wihile sitting. The pt state when she stop and change her breathing the symptoms normally subsided.)   HPI Palpitations Chest pain complaints in past that are non-specific. Palpitations are also ongoing complaint that has been previously hard for patient to describe for evaluation.  Patient's hearing is very decreased and communication is limited with masking (patient is lip reader). - Patient continues to note beating in her throat when she is running, also occurs rarely at rest.  Change breathing (demonstrated as slower/deeper breathing pattern) is helpful for getting symptoms to subside. Uncertain if this is airway constriction or heart palpitations, but is most consistent with description of heart palpitations.  - Patient states the beating is happening centrally at her trachea. - not feeling sense of tightening of esophagus/reflux - Patient does not feel beating on lateral neck.  - Patient is concerned about thyroid - feels no compression symptoms - Patient further describes this as an uncomfortable feeling in her throat - Patient has had hyperlipidemia in past - asks to get cholesterol checked, which is reasonable. She is fasting today  Cyst - RIGHT lower leg measures approx 3 cm x 1 cm  Social History   Tobacco Use  . Smoking status: Former Smoker    Packs/day: 0.50    Years: 10.00    Pack years: 5.00    Quit date: 03/10/2007    Years since quitting: 11.6  . Smokeless tobacco: Never Used  Substance Use Topics  . Alcohol use: Yes    Comment: occasional   . Drug use: No    Review of Systems Per HPI unless specifically indicated above     Objective:    BP 111/63 (BP  Location: Left Arm, Patient Position: Sitting, Cuff Size: Normal)   Pulse 77   Ht 5\' 5"  (1.651 m)   Wt 146 lb 3.2 oz (66.3 kg)   BMI 24.33 kg/m   Wt Readings from Last 3 Encounters:  10/22/18 146 lb 3.2 oz (66.3 kg)  10/01/18 144 lb (65.3 kg)  08/13/18 141 lb (64 kg)    Physical Exam Vitals signs reviewed.  Constitutional:      General: She is not in acute distress.    Appearance: She is well-developed.  HENT:     Head: Normocephalic and atraumatic.     Right Ear: Decreased hearing noted.     Left Ear: Decreased hearing noted.     Nose: Nose normal.     Mouth/Throat:     Lips: Pink.     Mouth: Mucous membranes are dry.     Pharynx: Oropharynx is clear. No pharyngeal swelling.     Tonsils: No tonsillar exudate. 0 on the right. 0 on the left.  Cardiovascular:     Rate and Rhythm: Normal rate and regular rhythm.     Pulses: Normal pulses.     Heart sounds: No murmur. No friction rub. No gallop.   Pulmonary:     Effort: Pulmonary effort is normal.     Breath sounds: Normal breath sounds and air entry.  Musculoskeletal:     Right lower leg: No edema.     Left lower leg: No edema.  Skin:    General:  Skin is warm and dry.  Neurological:     Mental Status: She is alert and oriented to person, place, and time.  Psychiatric:        Behavior: Behavior normal.    Results for orders placed or performed in visit on 10/01/18  Hemoglobin A1c  Result Value Ref Range   Hgb A1c MFr Bld 5.3 <5.7 % of total Hgb   Mean Plasma Glucose 105 (calc)   eAG (mmol/L) 5.8 (calc)  TSH  Result Value Ref Range   TSH 0.58 mIU/L  Comprehensive metabolic panel  Result Value Ref Range   Glucose, Bld 95 65 - 99 mg/dL   BUN 16 7 - 25 mg/dL   Creat 0.91 0.50 - 1.10 mg/dL   BUN/Creatinine Ratio NOT APPLICABLE 6 - 22 (calc)   Sodium 140 135 - 146 mmol/L   Potassium 4.1 3.5 - 5.3 mmol/L   Chloride 104 98 - 110 mmol/L   CO2 27 20 - 32 mmol/L   Calcium 9.4 8.6 - 10.2 mg/dL   Total Protein 7.1 6.1 -  8.1 g/dL   Albumin 4.3 3.6 - 5.1 g/dL   Globulin 2.8 1.9 - 3.7 g/dL (calc)   AG Ratio 1.5 1.0 - 2.5 (calc)   Total Bilirubin 0.9 0.2 - 1.2 mg/dL   Alkaline phosphatase (APISO) 57 31 - 125 U/L   AST 16 10 - 30 U/L   ALT 14 6 - 29 U/L  CBC with Differential/Platelet  Result Value Ref Range   WBC 5.3 3.8 - 10.8 Thousand/uL   RBC 4.37 3.80 - 5.10 Million/uL   Hemoglobin 14.3 11.7 - 15.5 g/dL   HCT 42.6 35.0 - 45.0 %   MCV 97.5 80.0 - 100.0 fL   MCH 32.7 27.0 - 33.0 pg   MCHC 33.6 32.0 - 36.0 g/dL   RDW 12.6 11.0 - 15.0 %   Platelets 234 140 - 400 Thousand/uL   MPV 9.2 7.5 - 12.5 fL   Neutro Abs 3,657 1,500 - 7,800 cells/uL   Lymphs Abs 1,171 850 - 3,900 cells/uL   Absolute Monocytes 429 200 - 950 cells/uL   Eosinophils Absolute 11 (L) 15 - 500 cells/uL   Basophils Absolute 32 0 - 200 cells/uL   Neutrophils Relative % 69 %   Total Lymphocyte 22.1 %   Monocytes Relative 8.1 %   Eosinophils Relative 0.2 %   Basophils Relative 0.6 %  Cytology - PAP  Result Value Ref Range   Adequacy      Satisfactory for evaluation  endocervical/transformation zone component PRESENT.   Diagnosis      NEGATIVE FOR INTRAEPITHELIAL LESIONS OR MALIGNANCY.   HPV NOT Detected    Material Submitted CervicoVaginal Pap [ThinPrep Imaged]       Assessment & Plan:   Problem List Items Addressed This Visit    None    Visit Diagnoses    Other chest pain    -  Primary   Relevant Orders   EKG 12-Lead   Lipid panel   Intermittent palpitations       Relevant Orders   Ambulatory referral to Cardiology   Exertional dyspnea       Relevant Medications   albuterol (VENTOLIN HFA) 108 (90 Base) MCG/ACT inhaler   Other Relevant Orders   Ambulatory referral to Cardiology    Patient with description of intermittent "throat beating" that is difficult to evaluate due to patient's inability to describe symptoms further.  Occurs during exercise most frequently and could be  palpitations/SVT vs airway  spasm/bronchospasm.   Plan: 1. EKG - repeated today -normal 2. Recommend cardiac eval due to recent reports chest pain and current ongoing symptoms. Due to exertional nature of symptoms, may benefit from stress test. 3. Possible bronchospasm - exertional asthma - can trial albuterol prior to exercise and monitor symptoms.  Stop if beating worsens as this may be tachycardia due to albuterol. 4. Follow-up prn 2-4 weeks.   Meds ordered this encounter  Medications  . albuterol (VENTOLIN HFA) 108 (90 Base) MCG/ACT inhaler    Sig: Inhale 1-2 puffs into the lungs every 6 (six) hours as needed (prior to exercise OR when feeling throat beating).    Dispense:  8 g    Refill:  0    Product selection permitted for insurance/patient brand preference    Order Specific Question:   Supervising Provider    Answer:   Olin Hauser [[id number]]    Follow up plan: Return 2 weeks if symptoms worsen or fail to improve.  Cassell Smiles, DNP, AGPCNP-BC Adult Gerontology Primary Care Nurse Practitioner [facility] 10/22/2018, 10:25 AM

## 2018-10-22 NOTE — Patient Instructions (Addendum)
Vicki Walters,   Thank you for coming in to clinic today.  1. Your throat beating is difficult to evaluate because it doesn't fit into a usual presentation of chest pain/palpitations or asthma.  I believe it is one or the other.  However, due to chest pain in the past I want to make sure you have a cardiac evaluation with possible stress test.  2. START albuterol 1-2 puffs every 6 hours as needed for airway spasm.  May use this prior to exercise to see if it prevents your throat symptoms.  If not helpful, stop using this.  It may cause heart racing.  If it does, stop using this until you see cardiology.  Please schedule a follow-up appointment. Return 2 weeks if symptoms worsen or fail to improve.  If you have any other questions or concerns, please feel free to call the clinic or send a message through [place]. You may also schedule an earlier appointment if necessary.  You will receive a survey after today's visit either digitally by e-mail or paper by [employer]. Your experiences and feedback matter to [place].  Please respond so we know how we are doing as we provide care for you.  Cassell Smiles, DNP, AGNP-BC Adult Gerontology Nurse Practitioner [facility], [facility]   Palpitations Palpitations are feelings that your heartbeat is irregular or is faster than normal. It may feel like your heart is fluttering or skipping a beat. Palpitations are usually not a serious problem. They may be caused by many things, including smoking, caffeine, alcohol, stress, and certain medicines or drugs. Most causes of palpitations are not serious. However, some palpitations can be a sign of a serious problem. You may need further tests to rule out serious medical problems. Follow these instructions at home:     Pay attention to any changes in your condition. Take these actions to help manage your symptoms: Eating and drinking  Avoid foods and drinks that may cause palpitations. These may  include: ? Caffeinated coffee, tea, soft drinks, diet pills, and energy drinks. ? Chocolate. ? Alcohol. Lifestyle  Take steps to reduce your stress and anxiety. Things that can help you relax include: ? Yoga. ? Mind-body activities, such as deep breathing, meditation, or using words and images to create positive thoughts (guided imagery). ? Physical activity, such as swimming, jogging, or walking. Tell your health care provider if your palpitations increase with activity. If you have chest pain or shortness of breath with activity, do not continue the activity until you are seen by your health care provider. ? Biofeedback. This is a method that helps you learn to use your mind to control things in your body, such as your heartbeat.  Do not use drugs, including cocaine or ecstasy. Do not use marijuana.  Get plenty of rest and sleep. Keep a regular bed time. General instructions  Take over-the-counter and prescription medicines only as told by your health care provider.  Do not use any products that contain nicotine or tobacco, such as cigarettes and e-cigarettes. If you need help quitting, ask your health care provider.  Keep all follow-up visits as told by your health care provider. This is important. These may include visits for further testing if palpitations do not go away or get worse. Contact a health care provider if you:  Continue to have a fast or irregular heartbeat after 24 hours.  Notice that your palpitations occur more often. Get help right away if you:  Have chest pain  or shortness of breath.  Have a severe headache.  Feel dizzy or you faint. Summary  Palpitations are feelings that your heartbeat is irregular or is faster than normal. It may feel like your heart is fluttering or skipping a beat.  Palpitations may be caused by many things, including smoking, caffeine, alcohol, stress, certain medicines, and drugs.  Although most causes of palpitations are not  serious, some causes can be a sign of a serious medical problem.  Get help right away if you faint or have chest pain, shortness of breath, a severe headache, or dizziness. This information is not intended to replace advice given to you by your health care provider. Make sure you discuss any questions you have with your health care provider. Document Released: 01/06/2000 Document Revised: 02/20/2017 Document Reviewed: 02/20/2017 Elsevier Patient Education  2020 [place].  Exercise-Induced Bronchoconstriction, Adult  Exercise-induced bronchospasm (EIB) happens when the airways narrow during or after vigorous activity or exercise. The airways are the passages that lead from the nose and mouth down into the lungs. When the airways narrow, this can cause coughing, wheezing, and shortness of breath. This makes it hard to breathe. Anyone can develop EIB, even people who do not have allergies or asthma. With proper treatment, most people with EIB can be active and exercise normally. What are the causes? The exact cause of this condition is not known. Symptoms are brought on (triggered) by physical activity. EIB can also be triggered by:  Breathing very cold and dry or hot and humid air.  Chemicals, such as chlorine in swimming pools, pesticides, or fertilizers.  Outdoor triggers, such as: Geneticist, molecular pollution. ? Car exhaust. ? Pollen from grass, trees, or flowers. ? Campfire smoke.  Indoor triggers, such as: ? Dust. ? Mold. ? Tobacco smoke. ? Cleaning solutions. ? Animal dander. What increases the risk? You are more likely to develop this condition if you:  Have asthma.  Participate in sports that require constant motion, such as basketball, hockey, skiing, and swimming.  Work outdoors.  Exercise where there are higher levels of one or more EIB triggers. What are the signs or symptoms? Symptoms of this condition include:  Coughing.  Wheezing.  Shortness of breath.  Chest pain  or tightness.  Sore throat.  Upset stomach. Symptoms may worsen after exercise has stopped. How is this diagnosed? EIB is diagnosed with your medical history and a physical exam. You may also have other tests, including:  Lung function studies (spirometry).  An exercise test to check for EIB symptoms.  Allergy tests. How is this treated? Treatment for EIB includes preventing symptoms when possible, and treating EIB quickly when symptoms occur. Treatment may include:  Taking medicine that your health care provider prescribes. Medicine comes in different forms, including: ? Medicines that you breathe in (inhale). These include:  Steroids. These help to control your symptoms and are usually taken every day.  Quick relief medicines. These help to quickly relieve your breathing difficulty. ? Medicines that you take by mouth (orally). These help to control allergies and asthma.  Avoiding triggers.  Stopping physical activity or exercise to rest. Follow these instructions at home:  Take over-the-counter and prescription medicines only as told by your health care provider.  Do not use products that contain nicotine or tobacco, such as cigarettes, e-cigarettes, and chewing tobacco. If you need help quitting, ask your health care provider.  Make changes in your workout as told by your health care provider. Exercise is important to  your health and well-being. ? Keep quick relief medicine with you when you are exercising. ? Tell your exercise partners about your condition. Wear a medical ID bracelet. ? If you are planning to exercise alone or in an isolated area, let someone know where you are going and when you will be back.  You may need to see a health care provider who specializes in allergies (allergist) or the lungs (pulmonologist) for more tests.  Keep all follow-up visits as told by your health care provider. This is important. How is this prevented?  Take medicines to  prevent exercise-induced bronchospasm as told by your health care provider.  Warm up before starting to play sports or exercise.  Exercise indoors to avoid outdoor triggers.  Cover your nose and mouth with a scarf to warm air that is very cold.  Tell your workout partners or trainer about your condition. Tell them how to help you if you have an episode. Contact a health care provider if:  You have coughing, wheezing, or shortness of breath that continues after treatment.  Your coughing wakes you up at night.  You have less endurance than you used to. Get help right away if:  Your medicine is not helping you breathe better.  You cannot catch your breath.  You pass out. Summary  Exercise-induced bronchospasm (EIB) happens when the airways narrow during or after exercise.  When the airways narrow, this can cause coughing, wheezing, and shortness of breath. It can be difficult to breathe.  Take over-the-counter and prescription medicines only as told by your health care provider.  Make changes in your workout as told by your health care provider.  Contact a health care provider if you continue to have trouble breathing after treatment. This information is not intended to replace advice given to you by your health care provider. Make sure you discuss any questions you have with your health care provider. Document Released: 01/08/2005 Document Revised: 09/26/2017 Document Reviewed: 10/01/2017 Elsevier Patient Education  2020 [employer].

## 2018-10-23 LAB — LIPID PANEL
Cholesterol: 190 mg/dL (ref <200)
HDL: 53 mg/dL (ref >=50)
LDL Cholesterol (Calc): 122 mg/dL (calc) — ABNORMAL HIGH
Non-HDL Cholesterol (Calc): 137 mg/dL (calc) — ABNORMAL HIGH (ref <130)
Total CHOL/HDL Ratio: 3.6 (calc) (ref <5.0)
Triglycerides: 55 mg/dL (ref <150)

## 2018-11-10 ENCOUNTER — Other Ambulatory Visit: Payer: Self-pay | Admitting: Nurse Practitioner

## 2018-11-10 DIAGNOSIS — R0609 Other forms of dyspnea: Secondary | ICD-10-CM

## 2018-11-17 NOTE — Progress Notes (Signed)
New Outpatient Visit Date: 11/19/2018  Referring Provider: Mikey College, NP [place],  [place] [id number]  Chief Complaint: Palpitations  HPI:  Vicki Walters is a 43 y.o. female who is being seen today for the evaluation of palpitations and exertional dyspnea at the request of Vicki Walters. She has a history of thyroid disease, hearing impairment, and GERD. Over the last month, Vicki Walters has experienced palpitations during her runs.  They often cause her to stop and rest, with prompt resolution.  Most episodes last a minute or less.  At times, she also feels a "skipped beat" in her through at rest.  She sometimes feels like she can abort the episodes if she breaths harder when she first feels the palpitations come on.  Vicki Walters reports mild chest discomforted that accompanies the palpitations.  She denies associated shortness of breath and lightheadedness.  She reports and episode of chest pain several years ago; only workup was a chest radiograph that was normal.  She has never undergone further cardiac workup.  She reports having started omeprazole in July.  She consumes minimal caffeine.  --------------------------------------------------------------------------------------------------  Cardiovascular History & Procedures: Cardiovascular Problems:  Palpitations  Risk Factors:  None  Cath/PCI:  None  CV Surgery:  None  EP Procedures and Devices:  None  Non-Invasive Evaluation(s):  None  Recent CV Pertinent Labs: Lab Results  Component Value Date   CHOL 190 10/22/2018   CHOL 240 (H) 03/11/2015   HDL 53 10/22/2018   HDL 70 03/11/2015   LDLCALC 122 (H) 10/22/2018   TRIG 55 10/22/2018   CHOLHDL 3.6 10/22/2018   K 4.7 11/19/2018   K 3.3 (L) 06/18/2013   MG 2.1 11/19/2018   BUN 17 11/19/2018   BUN 13 06/18/2013   CREATININE 0.80 11/19/2018   CREATININE 0.91 10/01/2018     --------------------------------------------------------------------------------------------------  Past Medical History:  Diagnosis Date  . GERD (gastroesophageal reflux disease)   . Thyroid disease     History reviewed. No pertinent surgical history.  Current Meds  Medication Sig  . Cholecalciferol (VITAMIN D) 125 MCG (5000 UT) CAPS Take by mouth.  . norethindrone (MICRONOR) 0.35 MG tablet Take 1 tablet (0.35 mg total) by mouth daily.  Marland Kitchen omeprazole (PRILOSEC) 40 MG capsule Take 1 capsule (40 mg total) by mouth daily before breakfast.  . Prenat-Fe Carbonyl-FA-Omega 3 (ONE-A-DAY WOMENS PRENATAL 1) 28-0.8-235 MG CAPS Take by mouth.  . sucralfate (CARAFATE) 1 g tablet Take 1 tablet (1 g total) by mouth 4 (four) times daily -  with meals and at bedtime. As needed only (Patient taking differently: Take 1 g by mouth 3 (three) times daily as needed. As needed only)    Allergies: Patient has no known allergies.  Social History   Tobacco Use  . Smoking status: Former Smoker    Packs/day: 0.50    Years: 10.00    Pack years: 5.00    Quit date: 03/10/2007    Years since quitting: 11.7  . Smokeless tobacco: Never Used  Substance Use Topics  . Alcohol use: Yes    Comment: occasional   . Drug use: No    Family History  Problem Relation Age of Onset  . Heart disease Maternal Grandmother   . Heart attack Maternal Grandmother   . Cancer Maternal Grandfather   . Diabetes Paternal Grandfather   . Breast cancer Sister 75    Review of Systems: A 12-system review of systems was performed and was negative except as  noted in the HPI.  --------------------------------------------------------------------------------------------------  Physical Exam: BP 106/72 (BP Location: Right Arm, Patient Position: Sitting, Cuff Size: Normal)   Pulse 64   Ht 5\' 5"  (1.651 m)   Wt 145 lb 8 oz (66 kg)   SpO2 99%   BMI 24.21 kg/m   General:  NAD.  Accompanied by her husband. HEENT: No conjunctival  pallor or scleral icterus. Facemask in place Neck: Supple without lymphadenopathy, thyromegaly, JVD, or HJR. No carotid bruit. Lungs: Normal work of breathing. Clear to auscultation bilaterally without wheezes or crackles. Heart: Regular rate and rhythm without murmurs, rubs, or gallops. Non-displaced PMI. Abd: Bowel sounds present. Soft, NT/ND without hepatosplenomegaly Ext: No lower extremity edema. Radial, PT, and DP pulses are 2+ bilaterally Skin: Warm and dry without rash. Neuro: CNIII-XII intact. Strength and fine-touch sensation intact in upper and lower extremities bilaterally. Psych: Normal mood and affect.  EKG:  NSR without abnormality.  Lab Results  Component Value Date   WBC 5.3 10/01/2018   HGB 14.3 10/01/2018   HCT 42.6 10/01/2018   MCV 97.5 10/01/2018   PLT 234 10/01/2018    Lab Results  Component Value Date   NA 141 11/19/2018   K 4.7 11/19/2018   CL 103 11/19/2018   CO2 24 11/19/2018   BUN 17 11/19/2018   CREATININE 0.80 11/19/2018   GLUCOSE 80 11/19/2018   ALT 14 10/01/2018    Lab Results  Component Value Date   CHOL 190 10/22/2018   HDL 53 10/22/2018   LDLCALC 122 (H) 10/22/2018   TRIG 55 10/22/2018   CHOLHDL 3.6 10/22/2018   Lab Results  Component Value Date   TSH 0.58 10/01/2018   --------------------------------------------------------------------------------------------------  ASSESSMENT AND PLAN: Palpitations and chest pain: Episodes last one minute or less and typically occur during her runs.  Vicki Walters notes rare isolated skipped beats at rest as well.  We have agreed to obtain a 14-day event monitor to further characterize these palpitations.  I will also check a BMP, Mg level.  TSH was normal in 09/2018.  We will defer medication changes at this time.  Based on results of the event monitor, we will readdress the need for further testing (i.e. echocardiogram and/or exercise tolerance test).  Hypercholesterolemia: LDL mildly elevated.   In the absence of ASCVD, I do not recommend pharmacotherapy.  Follow-up: Return to clinic in 6 weeks.  Nelva Bush, MD 11/20/2018 8:17 PM

## 2018-11-19 ENCOUNTER — Other Ambulatory Visit: Payer: Self-pay

## 2018-11-19 ENCOUNTER — Ambulatory Visit (INDEPENDENT_AMBULATORY_CARE_PROVIDER_SITE_OTHER): Payer: BC Managed Care – PPO

## 2018-11-19 ENCOUNTER — Encounter: Payer: Self-pay | Admitting: Internal Medicine

## 2018-11-19 ENCOUNTER — Ambulatory Visit (INDEPENDENT_AMBULATORY_CARE_PROVIDER_SITE_OTHER): Payer: BC Managed Care – PPO | Admitting: Internal Medicine

## 2018-11-19 VITALS — BP 106/72 | HR 64 | Ht 65.0 in | Wt 145.5 lb

## 2018-11-19 DIAGNOSIS — R002 Palpitations: Secondary | ICD-10-CM

## 2018-11-19 DIAGNOSIS — R079 Chest pain, unspecified: Secondary | ICD-10-CM

## 2018-11-19 DIAGNOSIS — E7800 Pure hypercholesterolemia, unspecified: Secondary | ICD-10-CM

## 2018-11-19 NOTE — Patient Instructions (Signed)
Medication Instructions:  Your physician recommends that you continue on your current medications as directed. Please refer to the Current Medication list given to you today.  *If you need a refill on your cardiac medications before your next appointment, please call your pharmacy*  Lab Work: Your physician recommends that you return for lab work in: TODAY - BMP, MAG.  If you have labs (blood work) drawn today and your tests are completely normal, you will receive your results only by: Marland Kitchen MyChart Message (if you have MyChart) OR . A paper copy in the mail If you have any lab test that is abnormal or we need to change your treatment, we will call you to review the results.  Testing/Procedures: Your physician has recommended that you wear an 14 DAY ZIO event monitor. Event monitors are medical devices that record the heart's electrical activity. Doctors most often [place] these monitors to diagnose arrhythmias. Arrhythmias are problems with the speed or rhythm of the heartbeat. The monitor is a small, portable device. You can wear one while you do your normal daily activities. This is usually used to diagnose what is causing palpitations/syncope (passing out).  A Zio Patch Event Heart monitor will be applied to your chest today.  You will wear the patch for 14 days. After 24 hours, you may shower with the heart monitor on. If you feel any SYMPTOMS, you may press and release the button in the middle of the monitor. REMOVE on 12/03/2018.  Follow-Up: At [facility], you and your health needs are our priority.  As part of our continuing mission to provide you with exceptional heart care, we have created designated Provider Care Teams.  These Care Teams include your primary Cardiologist (physician) and Advanced Practice Providers (APPs -  Physician Assistants and Nurse Practitioners) who all work together to provide you with the care you need, when you need it.  Your next appointment:   6  weeks.  The format for your next appointment:   In Person  Provider:    You may see DR Harrell Gave END or one of the following Advanced Practice Providers on your designated Care Team:    Murray Hodgkins, NP  Christell Faith, PA-C  Marrianne Mood, PA-C

## 2018-11-20 ENCOUNTER — Encounter: Payer: Self-pay | Admitting: Internal Medicine

## 2018-11-20 DIAGNOSIS — R0789 Other chest pain: Secondary | ICD-10-CM | POA: Insufficient documentation

## 2018-11-20 DIAGNOSIS — R002 Palpitations: Secondary | ICD-10-CM | POA: Insufficient documentation

## 2018-11-20 DIAGNOSIS — R079 Chest pain, unspecified: Secondary | ICD-10-CM | POA: Insufficient documentation

## 2018-11-20 LAB — BASIC METABOLIC PANEL
BUN/Creatinine Ratio: 21 (ref 9–23)
BUN: 17 mg/dL (ref 6–24)
CO2: 24 mmol/L (ref 20–29)
Calcium: 9.2 mg/dL (ref 8.7–10.2)
Chloride: 103 mmol/L (ref 96–106)
Creatinine, Ser: 0.8 mg/dL (ref 0.57–1.00)
GFR calc Af Amer: 106 mL/min/{1.73_m2} (ref >59)
GFR calc non Af Amer: 92 mL/min/{1.73_m2} (ref >59)
Glucose: 80 mg/dL (ref 65–99)
Potassium: 4.7 mmol/L (ref 3.5–5.2)
Sodium: 141 mmol/L (ref 134–144)

## 2018-11-20 LAB — MAGNESIUM: Magnesium: 2.1 mg/dL (ref 1.6–2.3)

## 2018-12-31 ENCOUNTER — Other Ambulatory Visit: Payer: Self-pay

## 2018-12-31 ENCOUNTER — Ambulatory Visit (INDEPENDENT_AMBULATORY_CARE_PROVIDER_SITE_OTHER): Payer: BC Managed Care – PPO | Admitting: Internal Medicine

## 2018-12-31 VITALS — BP 112/70 | HR 65 | Ht 65.0 in | Wt 148.2 lb

## 2018-12-31 DIAGNOSIS — R1900 Intra-abdominal and pelvic swelling, mass and lump, unspecified site: Secondary | ICD-10-CM | POA: Diagnosis not present

## 2018-12-31 DIAGNOSIS — R002 Palpitations: Secondary | ICD-10-CM

## 2018-12-31 DIAGNOSIS — I471 Supraventricular tachycardia: Secondary | ICD-10-CM

## 2018-12-31 DIAGNOSIS — E785 Hyperlipidemia, unspecified: Secondary | ICD-10-CM

## 2018-12-31 DIAGNOSIS — R0789 Other chest pain: Secondary | ICD-10-CM | POA: Diagnosis not present

## 2018-12-31 NOTE — Progress Notes (Signed)
Follow-up Outpatient Visit Date: 12/31/2018  Primary Care Provider: Mikey College, NP (Inactive) [place] [id number]  Chief Complaint: Palpitations  HPI:  Vicki Walters is a 43 y.o. female with history of thyroid disease, hearing impairment, and GERD, who presents for follow-up of palpitations.  I met her in late October at which time she described intermittent palpitations, worse when she would run.  She also noted mild chest discomfort associated with the palpitations.  We agreed to obtain a 14-day event monitor, which showed predominantly sinus rhythm with rare PACs and PVCs as well as a single brief atrial run lasting 6 beats.  Today, Vicki Walters reports that she feels about the same as her last visit.  She still has palpitations intermittently during the day, most often associated with activity, though she sometimes notices them when she bends over.  She reports an associated vague discomfort in her chest as well as mild transient dyspnea.  She denies lightheadedness, orthopnea, PND, and edema.  Vicki Walters recently noticed pulsation in her upper abdomen.  She had been doing sit ups and had suddenly had acute onset of epigastric pain.  Though the pain has resolved, she is now aware of a pulsation in this area.  --------------------------------------------------------------------------------------------------  Cardiovascular History & Procedures: Cardiovascular Problems:  Palpitations  Risk Factors:  None  Cath/PCI:  None  CV Surgery:  None  EP Procedures and Devices:  14-day event monitor (11/19/2018): Predominantly sinus rhythm with rare PAC's and PVC's, as well as a single brief atrial run.  No sustained arrhythmia.  Non-Invasive Evaluation(s):  None  Recent CV Pertinent Labs: Lab Results  Component Value Date   CHOL 190 10/22/2018   CHOL 240 (H) 03/11/2015   HDL 53 10/22/2018   HDL 70 03/11/2015   LDLCALC 122 (H) 10/22/2018   TRIG 55 10/22/2018   CHOLHDL 3.6 10/22/2018   K 4.7 11/19/2018   K 3.3 (L) 06/18/2013   MG 2.1 11/19/2018   BUN 17 11/19/2018   BUN 13 06/18/2013   CREATININE 0.80 11/19/2018   CREATININE 0.91 10/01/2018    Past medical and surgical history were reviewed and updated in EPIC.  No outpatient medications have been marked as taking for the 12/31/18 encounter (Appointment) with Dayra Rapley, Harrell Gave, MD.    Allergies: Patient has no known allergies.  Social History   Tobacco Use  . Smoking status: Former Smoker    Packs/day: 0.50    Years: 10.00    Pack years: 5.00    Quit date: 03/10/2007    Years since quitting: 11.8  . Smokeless tobacco: Never Used  Substance Use Topics  . Alcohol use: Yes    Comment: occasional   . Drug use: No    Family History  Problem Relation Age of Onset  . Heart disease Maternal Grandmother   . Heart attack Maternal Grandmother   . Cancer Maternal Grandfather   . Diabetes Paternal Grandfather   . Breast cancer Sister 47    Review of Systems: A 12-system review of systems was performed and was negative except as noted in the HPI.  --------------------------------------------------------------------------------------------------  Physical Exam: There were no vitals taken for this visit.  General: NAD. HEENT: No conjunctival pallor or scleral icterus. Facemask in place. Neck: Supple without lymphadenopathy, thyromegaly, JVD, or HJR. Lungs: Normal work of breathing. Clear to auscultation bilaterally without wheezes or crackles. Heart: Regular rate and rhythm without murmurs, rubs, or gallops. Non-displaced PMI. Abd: Bowel sounds present. Soft, NT/ND without hepatosplenomegaly.  Pulsatile mass noted midline in the abdomen. Ext: No lower extremity edema. Radial, PT, and DP pulses are 2+ bilaterally. Skin: Warm and dry without rash.  Lab Results  Component Value Date   WBC 5.3 10/01/2018   HGB 14.3 10/01/2018   HCT 42.6 10/01/2018   MCV 97.5  10/01/2018   PLT 234 10/01/2018    Lab Results  Component Value Date   NA 141 11/19/2018   K 4.7 11/19/2018   CL 103 11/19/2018   CO2 24 11/19/2018   BUN 17 11/19/2018   CREATININE 0.80 11/19/2018   GLUCOSE 80 11/19/2018   ALT 14 10/01/2018    Lab Results  Component Value Date   CHOL 190 10/22/2018   HDL 53 10/22/2018   LDLCALC 122 (H) 10/22/2018   TRIG 55 10/22/2018   CHOLHDL 3.6 10/22/2018    --------------------------------------------------------------------------------------------------  ASSESSMENT AND PLAN: Palpitations, PSVT, and atypical chest pain: I suspect symptoms are related to recently noted rare PACs, PVCs, and brief atrial runs.  We discussed the benign nature of these findings.  However, given exertional component with associated chest discomfort, I have recommended that we obtain a transthoracic echocardiogram to exclude a structural abnormality as well as an exercise tolerance test to exclude exercise induced arrhythmias.  We will defer pharmacotherapy at this time.  I have encouraged Ms. Gockel to limit her caffeine consumption.  Pulsatile abdominal mass: Midline pulsatile structure noted in the abdomen, distant with the aorta.  I do not appreciate any obvious dilation, though evaluation is somewhat limited.  I wonder if underlying rectus diastases could be contributing to prominence of aortic pulsation.  No risk factors for vascular disease are limited, given recent pain in the epigastrium and exam findings today, we will perform an ultrasound of the aorta to exclude vascular abnormality.  Hyperlipidemia: Mild LDL elevation noted in the past.  Defer pharmacotherapy unless ASCVD is identified on aforementioned testing.  Follow-up: Return to clinic shortly after aforementioned testing has been completed.  Nelva Bush, MD 12/31/2018 7:35 AM

## 2018-12-31 NOTE — Patient Instructions (Signed)
Medication Instructions:  Your physician recommends that you continue on your current medications as directed. Please refer to the Current Medication list given to you today.  *If you need a refill on your cardiac medications before your next appointment, please call your pharmacy*  Lab Work: None  If you have labs (blood work) drawn today and your tests are completely normal, you will receive your results only by: Marland Kitchen MyChart Message (if you have MyChart) OR . A paper copy in the mail If you have any lab test that is abnormal or we need to change your treatment, we will call you to review the results.  Testing/Procedures: 1- Your physician has requested that you have an echocardiogram. Echocardiography is a painless test that uses sound waves to create images of your heart. It provides your doctor with information about the size and shape of your heart and how well your heart's chambers and valves are working. This procedure takes approximately one hour. There are no restrictions for this procedure. You may get an IV, if needed, to receive an ultrasound enhancing agent through to better visualize your heart.   2- Your physician has requested that you have an abdominal aorta duplex. During this test, an ultrasound is used to evaluate the aorta. Allow 30 minutes for this exam. Do not eat after midnight the day before and avoid carbonated beverages AORTA/IVC/ILIACS  No food after 11PM the night before.  Water is OK. (Don't drink liquids if you have been instructed not to for ANOTHER test).  Take two Extra-Strength Gas-X capsules at bedtime the night before test.   Take an additional two Extra-Strength Gas-X capsules three (3) hours before the test or first thing in the morning.    Avoid foods that produce bowel gas, for 24 hours prior to exam (see below).    No breakfast, no chewing gum, no smoking or carbonated beverages.  Patient may take morning medications with water.  Come in for  test at least 15 minutes early to register.   3- Your physician has requested that you have an exercise tolerance test. For further information please visit [email or web address]. Please also follow instruction sheet, as given.  DO NOT drink or eat foods with caffeine for 24 hours before the test. (Chocolate, coffee, tea, decaf coffee/tea, or energy drinks)  DO NOT smoke for 4 hours before your test.  If you use an inhaler, bring it with you to the test.  Wear comfortable shoes and clothing. Women do not wear dresses.   Follow-Up: At [facility], you and your health needs are our priority.  As part of our continuing mission to provide you with exceptional heart care, we have created designated Provider Care Teams.  These Care Teams include your primary Cardiologist (physician) and Advanced Practice Providers (APPs -  Physician Assistants and Nurse Practitioners) who all work together to provide you with the care you need, when you need it.  Your next appointment:   After testing is complete.  The format for your next appointment:   In Person  Provider:    You may see DR Harrell Gave END or one of the following Advanced Practice Providers on your designated Care Team:    Murray Hodgkins, NP  Christell Faith, PA-C  Marrianne Mood, PA-C   Echocardiogram An echocardiogram is a procedure that uses painless sound waves (ultrasound) to produce an image of the heart. Images from an echocardiogram can provide important information about:  Signs of coronary artery disease (CAD).  Aneurysm detection.  An aneurysm is a weak or damaged part of an artery wall that bulges out from the normal force of blood pumping through the body.  Heart size and shape. Changes in the size or shape of the heart can be associated with certain conditions, including heart failure, aneurysm, and CAD.  Heart muscle function.  Heart valve function.  Signs of a past heart attack.  Fluid buildup around the  heart.  Thickening of the heart muscle.  A tumor or infectious growth around the heart valves. Tell a health care provider about:  Any allergies you have.  All medicines you are taking, including vitamins, herbs, eye drops, creams, and over-the-counter medicines.  Any blood disorders you have.  Any surgeries you have had.  Any medical conditions you have.  Whether you are pregnant or may be pregnant. What are the risks? Generally, this is a safe procedure. However, problems may occur, including:  Allergic reaction to dye (contrast) that may be used during the procedure. What happens before the procedure? No specific preparation is needed. You may eat and drink normally. What happens during the procedure?   An IV tube may be inserted into one of your veins.  You may receive contrast through this tube. A contrast is an injection that improves the quality of the pictures from your heart.  A gel will be applied to your chest.  A wand-like tool (transducer) will be moved over your chest. The gel will help to transmit the sound waves from the transducer.  The sound waves will harmlessly bounce off of your heart to allow the heart images to be captured in real-time motion. The images will be recorded on a computer. The procedure may vary among health care providers and hospitals. What happens after the procedure?  You may return to your normal, everyday life, including diet, activities, and medicines, unless your health care provider tells you not to do that. Summary  An echocardiogram is a procedure that uses painless sound waves (ultrasound) to produce an image of the heart.  Images from an echocardiogram can provide important information about the size and shape of your heart, heart muscle function, heart valve function, and fluid buildup around your heart.  You do not need to do anything to prepare before this procedure. You may eat and drink normally.  After the  echocardiogram is completed, you may return to your normal, everyday life, unless your health care provider tells you not to do that. This information is not intended to replace advice given to you by your health care provider. Make sure you discuss any questions you have with your health care provider. Document Released: 01/06/2000 Document Revised: 05/01/2018 Document Reviewed: 02/11/2016 Elsevier Patient Education  [place].    Exercise Stress Test An exercise stress test is a test to check how your heart works during exercise. You will need to walk on a treadmill or ride an exercise bike for this test. An electrocardiogram (ECG) will record your heartbeat when you are at rest and when you are exercising. You may have an ultrasound or nuclear test after the exercise test. The test is done to check for coronary artery disease (CAD). It is also done to:  See how well you can exercise.  Watch for high blood pressure during exercise.  Test how well you can exercise after treatment.  Check the blood flow to your arms and legs. If your test result is not normal, more testing may be needed. What happens before the  procedure?  Follow instructions from your doctor about what you cannot eat or drink. ? Do not have any drinks or foods that have caffeine in them for 24 hours before the test, or as told by your doctor. This includes coffee, tea (even decaf tea), sodas, chocolate, and cocoa.  Ask your doctor about changing or stopping your normal medicines. This is important if you: ? Take diabetes medicines. ? Take beta-blocker medicines. ? Wear a nitroglycerin patch.  If you use an inhaler, bring it with you to the test.  Do not put lotions, powders, creams, or oils on your chest before the test.  Wear comfortable shoes and clothing.  Do not use any products that have nicotine or tobacco in them, such as cigarettes and e-cigarettes. Stop using them at least 4 hours before the test.  If you need help quitting, ask your doctor. What happens during the procedure?   Patches (electrodes) will be put on your chest.  Wires will be connected to the patches. The wires will send signals to a machine to record your heartbeat.  Your heart rate will be watched while you are resting and while you are exercising. Your blood pressure will also be watched during the test.  You will walk on a treadmill or use a stationary bike. If you cannot use these, you may be asked to turn a crank with your hands.  The activity will get harder and will raise your heart rate.  You may be asked to breathe into a tube a few times during the test. This measures the gases that you breathe out.  You will be asked how you are feeling throughout the test.  You will exercise until your heart reaches a target heart rate. You will stop early if: ? You feel dizzy. ? You have chest pain. ? You are out of breath. ? Your blood pressure is too high or too low. ? You have an irregular heartbeat. ? You have pain or aching in your arms or legs. The procedure may vary among doctors and hospitals. What happens after the procedure?  Your blood pressure, heart rate, breathing rate, and blood oxygen level will be watched after the test.  You may return to your normal diet and activities as told by your doctor.  It is up to you to get the results of your test. Ask your doctor, or the department that is doing the test, when your results will be ready. Summary  An exercise stress test is a test to check how your heart works during exercise.  This test is done to check for coronary artery disease.  Your heart rate will be watched while you are resting and while you are exercising.  Follow instructions from your doctor about what you cannot eat or drink before the test. This information is not intended to replace advice given to you by your health care provider. Make sure you discuss any questions you have with  your health care provider. Document Released: 06/27/2007 Document Revised: 04/22/2018 Document Reviewed: 04/10/2016 Elsevier Patient Education  2020 [employer].

## 2019-01-01 ENCOUNTER — Encounter: Payer: Self-pay | Admitting: Internal Medicine

## 2019-01-01 ENCOUNTER — Telehealth: Payer: Self-pay | Admitting: Internal Medicine

## 2019-01-01 DIAGNOSIS — E785 Hyperlipidemia, unspecified: Secondary | ICD-10-CM | POA: Insufficient documentation

## 2019-01-01 DIAGNOSIS — R1900 Intra-abdominal and pelvic swelling, mass and lump, unspecified site: Secondary | ICD-10-CM | POA: Insufficient documentation

## 2019-01-01 DIAGNOSIS — I471 Supraventricular tachycardia: Secondary | ICD-10-CM | POA: Insufficient documentation

## 2019-01-01 NOTE — Telephone Encounter (Signed)
Lm with Joe spouse on DPR.  He and patient want to call back when able to schedule.   ETT AAA Echo and fu after with Dr. Saunders Revel

## 2019-01-05 ENCOUNTER — Telehealth: Payer: Self-pay | Admitting: Internal Medicine

## 2019-01-05 NOTE — Telephone Encounter (Signed)
I agree with the aforementioned recommendations.  Nelva Bush, MD [facility]

## 2019-01-05 NOTE — Telephone Encounter (Signed)
Pt c/o of Chest Pain: STAT if CP now or developed within 24 hours  1. Are you having CP right now? yes  2. Are you experiencing any other symptoms (ex. SOB, nausea, vomiting, sweating)? No other symptoms  3. How long have you been experiencing CP? Started last night  4. Is your CP continuous or coming and going? Continual  5. Have you taken Nitroglycerin? no?

## 2019-01-05 NOTE — Telephone Encounter (Signed)
Received incoming call from patient's husband, ok per DPR. He reports patient started having chest pain last night. All over her chest. She describes it as constant, achy and sharp. Pain level "5" out of 10. Felt something on and off in left arm. Denies shortness of breath, jaw pain, dizziness, nausea or vomiting. She is unable to find relief with position change.  Patient lifted weights this past Friday and patient is not sure if it is pain for the activity or not.  Advised husband I would discuss with provider and give him a call back quickly.  Otherwise, if worsens or new symptoms develop to go to the emergency room.  Stress test is scheduled for 12/30, and AAA ultrasound for 02/05/19. Discussed with Laurann Montana, NP. After review of symptoms and chart she advised for patient to: 1- try OTC Tylenol or Motrin/ 2- If chest pain does not resolve, then to call 911 or go to the Emergency room. 3- She also suggested to try to move the stress test to a sooner time.   Attempted to call patient's husband back, no answer and left message to call us back as soon as possible.

## 2019-01-05 NOTE — Telephone Encounter (Signed)
Patient's husband calling back. In discussing recommendation with me, he was texting his wife. She said she took Tylenol about 1 hour ago and is still having chest pain on the left chest. With this new information, recommended to him that patient should go to the Emergency room for further evaluation. He verbalized understanding and will have the patient proceed to the ER. He will call back the next day or so to reschedule the stress test appointment.  Discussed again with Laurann Montana, NP and she agreed with recommendations.  Routing to Dr End to make him aware.

## 2019-01-06 NOTE — Telephone Encounter (Signed)
I believe this may have been routed to me in error. I have never seen this patient.

## 2019-01-08 NOTE — Telephone Encounter (Signed)
Called and spoke with patient's husband, ok per DPR. He states patient's chest pain has subsided. I offered to reschedule the stress test to a sooner time and at this time will keep them where they are. Advised for patient to go to ER if chest pain returns or she develops new or worsening symptoms.

## 2019-01-20 ENCOUNTER — Other Ambulatory Visit
Admission: RE | Admit: 2019-01-20 | Discharge: 2019-01-20 | Disposition: A | Discharge status: Home / Self Care | Payer: BC Managed Care – PPO | Source: Ambulatory Visit | Attending: Internal Medicine | Admitting: Internal Medicine

## 2019-01-20 ENCOUNTER — Other Ambulatory Visit: Payer: Self-pay

## 2019-01-20 ENCOUNTER — Other Ambulatory Visit: Payer: BC Managed Care – PPO

## 2019-01-20 DIAGNOSIS — Z01812 Encounter for preprocedural laboratory examination: Secondary | ICD-10-CM | POA: Diagnosis not present

## 2019-01-20 DIAGNOSIS — Z20828 Contact with and (suspected) exposure to other viral communicable diseases: Secondary | ICD-10-CM | POA: Diagnosis not present

## 2019-01-20 LAB — SARS CORONAVIRUS 2 (TAT 6-24 HRS): SARS Coronavirus 2: NEGATIVE

## 2019-01-21 ENCOUNTER — Ambulatory Visit (INDEPENDENT_AMBULATORY_CARE_PROVIDER_SITE_OTHER): Payer: BC Managed Care – PPO

## 2019-01-21 DIAGNOSIS — I471 Supraventricular tachycardia: Secondary | ICD-10-CM

## 2019-01-21 DIAGNOSIS — R002 Palpitations: Secondary | ICD-10-CM | POA: Diagnosis not present

## 2019-01-21 LAB — EXERCISE TOLERANCE TEST
Estimated workload: 13.7 METS
Exercise duration (min): 11 min
Exercise duration (sec): 59 s
MPHR: 179 {beats}/min
Peak HR: 157 {beats}/min
Percent HR: 87 %
Rest HR: 82 {beats}/min

## 2019-01-29 ENCOUNTER — Other Ambulatory Visit: Payer: Self-pay | Admitting: Internal Medicine

## 2019-01-29 DIAGNOSIS — I471 Supraventricular tachycardia: Secondary | ICD-10-CM

## 2019-02-05 ENCOUNTER — Ambulatory Visit (INDEPENDENT_AMBULATORY_CARE_PROVIDER_SITE_OTHER): Payer: BC Managed Care – PPO

## 2019-02-05 ENCOUNTER — Other Ambulatory Visit: Payer: Self-pay

## 2019-02-05 DIAGNOSIS — I471 Supraventricular tachycardia: Secondary | ICD-10-CM

## 2019-02-05 DIAGNOSIS — R1900 Intra-abdominal and pelvic swelling, mass and lump, unspecified site: Secondary | ICD-10-CM | POA: Diagnosis not present

## 2019-02-18 ENCOUNTER — Ambulatory Visit: Payer: BC Managed Care – PPO | Admitting: Internal Medicine

## 2019-02-25 ENCOUNTER — Ambulatory Visit (INDEPENDENT_AMBULATORY_CARE_PROVIDER_SITE_OTHER): Payer: BC Managed Care – PPO | Admitting: Internal Medicine

## 2019-02-25 ENCOUNTER — Other Ambulatory Visit: Payer: Self-pay

## 2019-02-25 ENCOUNTER — Encounter: Payer: Self-pay | Admitting: Internal Medicine

## 2019-02-25 VITALS — BP 122/58 | HR 76 | Ht 65.0 in | Wt 153.0 lb

## 2019-02-25 DIAGNOSIS — I517 Cardiomegaly: Secondary | ICD-10-CM

## 2019-02-25 DIAGNOSIS — R0789 Other chest pain: Secondary | ICD-10-CM

## 2019-02-25 DIAGNOSIS — R002 Palpitations: Secondary | ICD-10-CM

## 2019-02-25 NOTE — Progress Notes (Signed)
Follow-up Outpatient Visit Date: 02/25/2019  Primary Care Provider: Mikey College, NP (Inactive) [place] [address]  Chief Complaint: Palpitations and chest pain  HPI:  Vicki Walters is a 44 y.o. female with history of thyroid disease, hearing impairment,and GERD, who presents for follow-up of palpitations and brief PSVT noted on event monitor.  I last saw her in 12/2018, at which time she reported feeling about the same with intermittent palpitations throughout the day, particularly with activity.  She also endorsed vague chest discomfort and transient shortness of breath, as well as a pulsation in her upper abdomen.  Subsequent testing (GXT, TTE, and abdominal vascular ultrasound) were unrevealing.  Today, Vicki Walters reports feeling about the same as at prior visit.  She continues to have intermittent extra beats (both at rest and when running).  She also notes a few episodes of sharp, central chest pain without associated symptoms.  These always occur at rest.  He has also noted an area of mild swelling and discoloration along her right calf.  It is not painful.  Vicki Walters has not cut down on her caffeine consumption.  She rarely snores and generally feels well-resting when she wakes up.  She does not nap during the day.  Her husband has not witnessed any apneic episodes.  --------------------------------------------------------------------------------------------------  Cardiovascular History & Procedures: Cardiovascular Problems:  Palpitations  Chest pain  Risk Factors:  None  Cath/PCI:  None  CV Surgery:  None  EP Procedures and Devices:  14-day event monitor (11/19/18): Predominantly sinus rhythm with rare PAC's and PVC's, as well as a single brief atrial run. No sustained arrhythmia.  Non-Invasive Evaluation(s):  Abdominal aortic duplex (02/05/19): Normal study without evidence of AAA.  TTE (02/05/19): Normal LV size and wall  thickness.  LVEF 60-65% with normal diastolic function.  Normal RV size and function.  Mild left atrial enlargement.  No significant valvular abnormality.  Exercise tolerance test (01/21/19): Low risk study without evidence of ischemia or arrhythmia, though evaluation was limited by significant motion artifact during stress.  Recent CV Pertinent Labs: Lab Results  Component Value Date   CHOL 190 10/22/2018   CHOL 240 (H) 03/11/2015   HDL 53 10/22/2018   HDL 70 03/11/2015   LDLCALC 122 (H) 10/22/2018   TRIG 55 10/22/2018   CHOLHDL 3.6 10/22/2018   K 4.7 11/19/2018   K 3.3 (L) 06/18/2013   MG 2.1 11/19/2018   BUN 17 11/19/2018   BUN 13 06/18/2013   CREATININE 0.80 11/19/2018   CREATININE 0.91 10/01/2018    Past medical and surgical history were reviewed and updated in EPIC.  Current Meds  Medication Sig  . Cholecalciferol (VITAMIN D) 125 MCG (5000 UT) CAPS Take by mouth.  . Prenat-Fe Carbonyl-FA-Omega 3 (ONE-A-DAY WOMENS PRENATAL 1) 28-0.8-235 MG CAPS Take by mouth.    Allergies: Patient has no known allergies.  Social History   Tobacco Use  . Smoking status: Former Smoker    Packs/day: 0.50    Years: 10.00    Pack years: 5.00    Quit date: 03/10/2007    Years since quitting: 11.9  . Smokeless tobacco: Never Used  Substance Use Topics  . Alcohol use: Yes    Comment: occasional   . Drug use: No    Family History  Problem Relation Age of Onset  . Heart disease Maternal Grandmother   . Heart attack Maternal Grandmother   . Cancer Maternal Grandfather   . Diabetes Paternal Grandfather   .  Breast cancer Sister 24    Review of Systems: A 12-system review of systems was performed and was negative except as noted in the HPI.  --------------------------------------------------------------------------------------------------  Physical Exam: BP (!) 122/58 (BP Location: Left Arm, Patient Position: Sitting, Cuff Size: Normal)   Pulse 76   Ht 5\' 5"  (1.651 m)   Wt 153  lb (69.4 kg)   SpO2 98%   BMI 25.46 kg/m   General:  NAD.  Accompanied by her husband. HEENT: No conjunctival pallor or scleral icterus. Facemask in place. Neck: No JVD or HJR. Lungs: Normal work of breathing. Clear to auscultation bilaterally without wheezes or crackles. Heart: Regular rate and rhythm without murmurs, rubs, or gallops.  Abd: Bowel sounds present. Soft, NT/ND without hepatosplenomegaly Ext: No lower extremity edema. Radial, PT, and DP pulses are 2+ bilaterally.  Varicose veins noted in right medial calf.  EKG:  NSR without abnormality.  Lab Results  Component Value Date   WBC 5.3 10/01/2018   HGB 14.3 10/01/2018   HCT 42.6 10/01/2018   MCV 97.5 10/01/2018   PLT 234 10/01/2018    Lab Results  Component Value Date   NA 141 11/19/2018   K 4.7 11/19/2018   CL 103 11/19/2018   CO2 24 11/19/2018   BUN 17 11/19/2018   CREATININE 0.80 11/19/2018   GLUCOSE 80 11/19/2018   ALT 14 10/01/2018    Lab Results  Component Value Date   CHOL 190 10/22/2018   HDL 53 10/22/2018   LDLCALC 122 (H) 10/22/2018   TRIG 55 10/22/2018   CHOLHDL 3.6 10/22/2018    --------------------------------------------------------------------------------------------------  ASSESSMENT AND PLAN: Palpitations: Stable and brief without associated symptoms.  Workup thus far has been reassuring.  Monitor showed rare PAC's and PVC's, as well as brief atrial run.  I reassured Vicki Walters that these findings are benign.  I encouraged her to reduce/eliminate her caffeine intake.  We discussed addition of medication, such as low-dose beta blocker, but Vicki Walters wishes to defer this.  Left atrial enlargement: Incidentally noted on recent echo.  I have personally reviewed the echo, which shows very mild enlargement of the left atrium.  There are no other significant structural abnormalities or comorbidities.  No further workup at this time.  Chest pain: Pain is sharp, non-exertional, and  self-limited, not consistent with coronary insufficiency.  Recent exercise tolerance test was reassuring, though significant artifact during peak stress was noted.  I do not recommend further cardiac testing unless symptoms worsen.  Follow-up: Return to clinic in 6 months.  Nelva Bush, MD 02/27/2019 8:49 AM

## 2019-02-25 NOTE — Patient Instructions (Signed)
Medication Instructions:  Your physician recommends that you continue on your current medications as directed. Please refer to the Current Medication list given to you today.  *If you need a refill on your cardiac medications before your next appointment, please call your pharmacy*  Lab Work: none If you have labs (blood work) drawn today and your tests are completely normal, you will receive your results only by: . MyChart Message (if you have MyChart) OR . A paper copy in the mail If you have any lab test that is abnormal or we need to change your treatment, we will call you to review the results.  Testing/Procedures: none  Follow-Up: At [facility], you and your health needs are our priority.  As part of our continuing mission to provide you with exceptional heart care, we have created designated Provider Care Teams.  These Care Teams include your primary Cardiologist (physician) and Advanced Practice Providers (APPs -  Physician Assistants and Nurse Practitioners) who all work together to provide you with the care you need, when you need it.  Your next appointment:   6 months  The format for your next appointment:   In Person  Provider:    You may see DR CHRISTOPHER END or one of the following Advanced Practice Providers on your designated Care Team:    Christopher Berge, NP  Ryan Dunn, PA-C  Jacquelyn Visser, PA-C  

## 2019-02-27 ENCOUNTER — Encounter: Payer: Self-pay | Admitting: Internal Medicine

## 2019-02-27 DIAGNOSIS — I517 Cardiomegaly: Secondary | ICD-10-CM | POA: Insufficient documentation

## 2019-06-12 ENCOUNTER — Ambulatory Visit (INDEPENDENT_AMBULATORY_CARE_PROVIDER_SITE_OTHER): Payer: BC Managed Care – PPO | Admitting: Family Medicine

## 2019-06-12 ENCOUNTER — Other Ambulatory Visit: Payer: Self-pay

## 2019-06-12 ENCOUNTER — Encounter: Payer: Self-pay | Admitting: Family Medicine

## 2019-06-12 VITALS — BP 107/67 | HR 72 | Temp 97.1°F | Ht 65.0 in | Wt 154.0 lb

## 2019-06-12 DIAGNOSIS — Z1231 Encounter for screening mammogram for malignant neoplasm of breast: Secondary | ICD-10-CM | POA: Diagnosis not present

## 2019-06-12 DIAGNOSIS — Z1239 Encounter for other screening for malignant neoplasm of breast: Secondary | ICD-10-CM | POA: Insufficient documentation

## 2019-06-12 DIAGNOSIS — M62838 Other muscle spasm: Secondary | ICD-10-CM | POA: Insufficient documentation

## 2019-06-12 MED ORDER — CYCLOBENZAPRINE HCL 5 MG PO TABS: 5.0000 mg | ORAL_TABLET | Freq: Three times a day (TID) | ORAL | 1 refills | Status: DC | PRN
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
## 2019-06-12 NOTE — Assessment & Plan Note (Signed)
Patient with family history of sister with breast cancer at young age and sister was BRCA negative.  Patient has not been BRCA tested.  Requesting to complete mammogram but reports was told was unable to as she is still breastfeeding.  Discussed will write order and can discuss with radiology department how to proceed.  Plan: 1. Mammogram order placed.

## 2019-06-12 NOTE — Assessment & Plan Note (Signed)
Bilateral trapezius muscle spasm/tightening on exam.  Discussed neck spasms and treatment with OTC ibuprofen, acetaminophen and low dose cyclobenzaprine as well as light massage to the muscles.  Plan: 1. Use OTC Ibuprofen/Acetaminophen as directed 2. Rx for cyclobenzaprine 5mg  TID PRN sent to pharmacy, education not to drive or operate heavy machinery due to sedation risk provided 3. Education on muscle massage provided 4. Follow up in 2-4 weeks if symptoms not resolving.

## 2019-06-12 NOTE — Patient Instructions (Signed)
As we discussed, you have a trapezius muscle spasm that is contributing to a tension headache.  I have sent in a prescription for Cyclobenzaprine 5mg  to take 1 tablet up to 3x per day as needed for muscle spasm.  This medication has been noted to be safe in breastfeeding.  1. For your Headaches / Neck pain - Overall it seems reassuring. I don't see any findings on exam that are concerning for serious head injury or neurological injury. 2. Start Flexeril 5mg  (muscle relaxant) - use half or whole tablet up to 3 times daily (may make you drowsy) 3. Start Naproxen (Naprosyn) or OTC Aleve 500mg  twice daily (with food) for 5 to 7 days or may do Ibuprofen / Advil (400mg  every 6 to 8 hours) with food for 5 to 7 days 4. Increase Tylenol to 1000mg  up to 3 times daily for breakthrough pain for 3-5 days then only as needed 5. Use moist heat or heating pad on shoulders / neck, and have family member help with soft tissue massage as demonstrated  Please schedule a follow-up appointment with [place] in 2 to 4 weeks if symptoms not improving, or sooner if worsening  If develop severe headaches, nausea / vomiting, loss of vision, numbness, weakness or tingling - call 911 or go immediately to ED.  For Mammogram screening for breast cancer - contact their office to discuss your concerns of breastfeeding and mammogram screening due to family history of breast cancer  Call the [facility] below anytime to schedule your own appointment now that order has been placed.  [facility] [address] [place], [place] [address] Phone: [phone]  [facility] [address] [place], [place] [address] Phone: [phone]  We will plan to see you back in 2-4 weeks for follow up  You will receive a survey after today's visit either digitally by e-mail or paper by [place] mail. Your experiences and feedback matter to [place].  Please respond so we know how we are  doing as we provide care for you.  Call us with any questions/concerns/needs.  It is my goal to be available to you for your health concerns.  Thanks for choosing me to be a partner in your healthcare needs!  Harlin Rain, FNP-C Family Nurse Practitioner [facility] Phone: [phone]

## 2019-08-06 ENCOUNTER — Ambulatory Visit
Admission: RE | Admit: 2019-08-06 | Discharge: 2019-08-06 | Disposition: A | Discharge status: Home / Self Care | Payer: BC Managed Care – PPO | Source: Ambulatory Visit | Attending: Family Medicine | Admitting: Family Medicine

## 2019-08-06 DIAGNOSIS — Z1231 Encounter for screening mammogram for malignant neoplasm of breast: Secondary | ICD-10-CM | POA: Diagnosis not present

## 2019-08-06 DIAGNOSIS — M25561 Pain in right knee: Secondary | ICD-10-CM | POA: Diagnosis not present

## 2019-08-26 ENCOUNTER — Ambulatory Visit: Payer: BC Managed Care – PPO | Admitting: Internal Medicine

## 2019-09-04 ENCOUNTER — Encounter: Payer: Self-pay | Admitting: Family

## 2019-09-04 ENCOUNTER — Ambulatory Visit (INDEPENDENT_AMBULATORY_CARE_PROVIDER_SITE_OTHER): Payer: BC Managed Care – PPO | Admitting: Family

## 2019-09-04 ENCOUNTER — Other Ambulatory Visit: Payer: Self-pay

## 2019-09-04 VITALS — BP 114/80 | HR 68 | Ht 65.0 in | Wt 157.0 lb

## 2019-09-04 DIAGNOSIS — R0789 Other chest pain: Secondary | ICD-10-CM

## 2019-09-04 DIAGNOSIS — R002 Palpitations: Secondary | ICD-10-CM | POA: Diagnosis not present

## 2019-09-04 NOTE — Patient Instructions (Signed)
Medication Instructions:  No medication changes today.  *If you need a refill on your cardiac medications before your next appointment, please call your pharmacy*   Lab Work: No lab work today.  Testing/Procedures: Your EKG today shows normal sinus rhythm which is a regular heart beat and a great result!  Follow-Up: At [facility], you and your health needs are our priority.  As part of our continuing mission to provide you with exceptional heart care, we have created designated Provider Care Teams.  These Care Teams include your primary Cardiologist (physician) and Advanced Practice Providers (APPs -  Physician Assistants and Nurse Practitioners) who all work together to provide you with the care you need, when you need it.  We recommend signing up for the patient portal called "MyChart".  Sign up information is provided on this After Visit Summary.  MyChart is used to connect with patients for Virtual Visits (Telemedicine).  Patients are able to view lab/test results, encounter notes, upcoming appointments, etc.  Non-urgent messages can be sent to your provider as well.   To learn more about what you can do with MyChart, go to [email or web address].    Your next appointment:   1 year(s)  The format for your next appointment:   In Person  Provider:   You may see Nelva Bush, MD or one of the following Advanced Practice Providers on your designated Care Team:    Murray Hodgkins, NP  Christell Faith, PA-C  Marrianne Mood, PA-C  Laurann Montana, NP   Other Instructions  We are so glad to hear your palpitations are better.  Your monitor that you wore previously showed just an occasional early beat called a PVC or PAC.  These are very common and not dangerous.  They often feel like the heart jumped, skipped a beat or race up in your throat like you described.  These early beats are often triggered by dehydration, stress, alcohol, caffeine.  Recommend staying adequately  hydrated and sticking to your 1 cup of coffee per day.  If you notice they are occurring more frequently we can prescribe medication to prevent them, but as your symptoms are well controlled today we will not start medication as you do not need it at this time.

## 2019-09-04 NOTE — Progress Notes (Signed)
Office Visit    Patient Name: Vicki Walters Date of Encounter: 09/04/2019  Primary Care Provider:  Verl Bangs, FNP Primary Cardiologist:  Nelva Bush, MD Electrophysiologist:  None   Chief Complaint    Vicki Walters is a 44 y.o. female with a hx of thyroid disease, hearing impairment, GERD, palpitations, brief PSVT noted on event monitor as well as infrequent PVC/PAC presents today for follow-up of palpitations  Past Medical History    Past Medical History:  Diagnosis Date  . GERD (gastroesophageal reflux disease)   . Thyroid disease    History reviewed. No pertinent surgical history.  Allergies  No Known Allergies  History of Present Illness    Vicki Walters is a 44 y.o. female with a hx of thyroid disease, hearing impairment, GERD, palpitations, brief PSVT noted on event monitor as well as infrequent PVC/PAC last seen 02/25/2019 by Dr. Saunders Revel.  She was seen December 2020 reporting intermittent palpitations and vague chest discomfort with transient shortness of breath.  Subsequent testing including GXT and TTE as well as abdominal vascular ultrasound for pulsation in upper abdomen were unrevealing.  Seen February 2021 with continued intermittent extra beats.  She was recommended to cut down caffeine consumption.  Low-dose beta-blocker was offered but she politely declined.  She was noted to not have any risk factors for sleep apnea.  Presents today for follow-up.  Reports no recurrent chest pain, pressure, tightness.  Tells me she ambulates well will feel her chest "catch "when she lifts her 75-year-old daughter but she attributes this to musculoskeletal pain and I am inclined to agree.  She has reduced her caffeine intake and drinks only 1 cup of coffee in the morning and then water throughout the day.  She continues to report intermittent palpitations but tells me these are not bothersome and infrequent.  We discussed the possibility of medication to prevent  them such as low-dose beta-blocker and she does not feel she needs that at this time.   EKGs/Labs/Other Studies Reviewed:   The following studies were reviewed today:  EKG:  EKG is ordered today.  The ekg ordered today demonstrates sinus rhythm 68 bpm with no acute ST/T wave changes.  Recent Labs: 10/01/2018: ALT 14; Hemoglobin 14.3; Platelets 234; TSH 0.58 11/19/2018: BUN 17; Creatinine, Ser 0.80; Magnesium 2.1; Potassium 4.7; Sodium 141  Recent Lipid Panel    Component Value Date/Time   CHOL 190 10/22/2018 1057   CHOL 240 (H) 03/11/2015 1347   TRIG 55 10/22/2018 1057   HDL 53 10/22/2018 1057   HDL 70 03/11/2015 1347   CHOLHDL 3.6 10/22/2018 1057   LDLCALC 122 (H) 10/22/2018 1057    Home Medications   Current Meds  Medication Sig  . Prenat-Fe Carbonyl-FA-Omega 3 (ONE-A-DAY WOMENS PRENATAL 1) 28-0.8-235 MG CAPS Take by mouth.      Review of Systems      Review of Systems  Constitutional: Negative for chills, fever and malaise/fatigue.  Cardiovascular: Positive for palpitations. Negative for chest pain, dyspnea on exertion, leg swelling, near-syncope, orthopnea and syncope.  Respiratory: Negative for cough, shortness of breath and wheezing.   Gastrointestinal: Negative for nausea and vomiting.  Neurological: Negative for dizziness, light-headedness and weakness.   All other systems reviewed and are otherwise negative except as noted above.  Physical Exam    VS:  BP 114/80 (BP Location: Left Arm, Patient Position: Sitting, Cuff Size: Normal)   Pulse 68   Ht 5\' 5"  (1.651 m)   Wt 157 lb (  71.2 kg)   SpO2 99%   BMI 26.13 kg/m  , BMI Body mass index is 26.13 kg/m. GEN: Well nourished, well developed, in no acute distress. HEENT: normal. Neck: Supple, no JVD, carotid bruits, or masses. Cardiac: RRR, no murmurs, rubs, or gallops. No clubbing, cyanosis, edema.  Radials/DP/PT 2+ and equal bilaterally.  Respiratory:  Respirations regular and unlabored, clear to auscultation  bilaterally. GI: Soft, nontender, nondistended, BS + x 4. MS: No deformity or atrophy. Skin: Warm and dry, no rash. Neuro:  Strength and sensation are intact. Psych: Normal affect.   Assessment & Plan    1. Palpitations -reports these are infrequent and not bothersome.  We reviewed her previous monitor showing occasional PVC/PAC.  We discussed preventative methods including remaining well-hydrated, avoiding caffeine/stress.  We discussed possibility of low-dose beta-blocker but she does not feel she needs medication at this time and politely declines.  2. Atypical chest pain -reports no recurrent chest pain, pressure, tightness.  No indication for ischemic evaluation at this time.  EKG today with no acute ST/T wave changes.  Disposition: Follow up in 1 year(s) with Dr. Saunders Revel or APP   Loel Dubonnet, NP 09/04/2019, 4:51 PM

## 2019-10-05 ENCOUNTER — Encounter: Payer: BC Managed Care – PPO | Admitting: Nurse Practitioner

## 2020-04-21 ENCOUNTER — Encounter: Payer: Self-pay | Admitting: Physician Assistant

## 2020-04-21 ENCOUNTER — Other Ambulatory Visit: Payer: Self-pay

## 2020-04-21 ENCOUNTER — Ambulatory Visit (INDEPENDENT_AMBULATORY_CARE_PROVIDER_SITE_OTHER): Payer: BC Managed Care – PPO | Admitting: Physician Assistant

## 2020-04-21 VITALS — BP 111/68 | HR 80 | Temp 97.8°F | Resp 18 | Ht 65.0 in | Wt 157.6 lb

## 2020-04-21 DIAGNOSIS — E049 Nontoxic goiter, unspecified: Secondary | ICD-10-CM | POA: Diagnosis not present

## 2020-04-21 DIAGNOSIS — M542 Cervicalgia: Secondary | ICD-10-CM | POA: Diagnosis not present

## 2020-04-21 DIAGNOSIS — Z13220 Encounter for screening for lipoid disorders: Secondary | ICD-10-CM

## 2020-04-21 DIAGNOSIS — Z85828 Personal history of other malignant neoplasm of skin: Secondary | ICD-10-CM | POA: Diagnosis not present

## 2020-04-21 NOTE — Patient Instructions (Signed)
Thyroid Cancer The thyroid is a gland in the front of the neck. It makes hormones that help control many body functions, including how the body uses energy (metabolism). Thyroid cancer is an abnormal growth of cancerous (malignant) cells in the thyroid gland. There are four main types of thyroid cancer:  Papillary cancer. This is the least harmful type of thyroid cancer. It typically affects women of childbearing age.  Follicular cancer. This type of cancer is the most likely to come back after treatment (recur) and spread to other parts of the body (metastasize).  Medullary cancer. This can be passed from parent to child (inherited). A person who inherits the gene for this cancer is at high risk for developing the cancer.  Anaplastic cancer. This type may spread quickly to the windpipe (trachea) and cause breathing problems. This type of cancer is most common among people age 53 and older. What are the causes? The exact cause of thyroid cancer is not known. What increases the risk? Risk factors for thyroid cancer include:  Exposure to radiation of the head, neck, or chest in the past, especially during infancy or childhood (such as from radiation therapy for cancer).  Having a thyroid that is larger than normal (enlarged). This may also be called a thyroid goiter.  A family history of thyroid disease.  Being female. What are the signs or symptoms? Symptoms of thyroid cancer may include:  Enlarged thyroid gland. This may look like a large lump or swelling in the lower, front area of the neck.  Hoarseness or a change in how your voice sounds.  A cough.  Coughing up blood.  Difficulty swallowing.  Shortness of breath. How is this diagnosed? This condition may be diagnosed based on:  Your symptoms and medical history.  Imaging tests of the neck area, such as: ? Ultrasound. ? CT scan. ? MRI. ? PET scan.  Blood tests.  Removal and testing of a thyroid tissue sample  (biopsy). This will help determine the type of thyroid cancer. Your cancer will be assessed (staged) to determine how severe it is and how much it has spread. How is this treated? Most thyroid cancers are treated with surgery to remove most or all of the thyroid gland (thyroidectomy). In some cases, the lymph nodes in the neck that are close to the thyroid may also be removed during surgery. Lymph nodes are part of the body's disease-fighting (immune) system, and they are usually the first place that cancer spreads to. Treatment may also involve:  Radioactive iodine treatment. This is a procedure that involves swallowing a substance (radioactive iodine, or radioiodine) that gets absorbed by the thyroid and destroys cancerous tissue. This may be given to: ? Destroy remaining tissue that could not be removed surgically. ? Treat thyroid cancer that has recurred or metastasized.  Medicines that kill cancer cells in the body (chemotherapy).  High-energy rays that kill cancer cells in the body (radiation therapy). You may have this if your cancer has spread to your bones.  A procedure to kill cancer cells in the thyroid by injecting them with alcohol (alcohol ablation).  Medicines that help your body's immune system fight the cancer cells (immunotherapy).  Thyroid hormone therapy to: ? Replace the hormone in the body that is normally made by the thyroid. ? Reduce the activity of (suppress) a hormone that activates the thyroid (thyroid-stimulating hormone, TSH). Follow these instructions at home:  Take over-the-counter and prescription medicines only as told by your health care provider.  Consider joining a support group for people who have thyroid cancer.  Eat a healthy diet that includes fruits and vegetables, whole grains, lean protein, and low-fat or nonfat dairy products.  Work with your health care provider to manage any side effects of treatment.  Keep all follow-up visits as told by  your health care provider. This is important. Contact a health care provider if:  You have nausea or vomiting.  You have diarrhea.  You have a rash.  You have a fever.  You have problems with urinating, such as: ? A burning sensation while urinating. ? Needing to urinate more often than usual. ? Pain or difficulty urinating. ? Blood in your urine.  You develop a new cough.  You have symptoms of too much thyroid hormone (hyperthyroidism), such as: ? Nervousness or anxiety. ? Weight loss without trying. ? Sweating. ? Difficulty sleeping. ? Hair loss. ? Heart palpitations. ? Frequent bowel movements.  You have symptoms of too little thyroid hormone (hypothyroidism), such as: ? Fatigue. ? Puffiness in your face, hands, or feet. ? Weight gain without trying. ? Feeling cold. ? Constipation. Get help right away if:  You have chest pain.  You have shortness of breath.  You suddenly feel too weak or dizzy to stand or walk. Summary  Thyroid cancer is an abnormal growth of cancerous (malignant) cells in the thyroid gland.  The exact cause of thyroid cancer is unknown. A number of factors increase the risk, including past exposure to radiation.  Diagnosis is based on imaging studies and a biopsy.  Most thyroid cancers are treated with surgery to remove most or all of the thyroid gland (thyroidectomy), followed by other treatments such as radiation therapy, hormone therapy, or radioactive iodine treatment. This information is not intended to replace advice given to you by your health care provider. Make sure you discuss any questions you have with your health care provider. Document Revised: 09/17/2019 Document Reviewed: 09/17/2019 Elsevier Patient Education  [place].

## 2020-04-21 NOTE — Progress Notes (Signed)
      Established patient visit   Patient: Vicki Walters   DOB: [birth year]   45 y.o. Female  MRN: [id number] Visit Date: 04/21/2020  Today's healthcare provider: Trinna Post, PA-C   Chief Complaint  Patient presents with  . Neck Pain    Intermittent Bilateral neck pain. The pt state when she turns her head it feels like a pulling sensation. She denies any swelling or tenderness when she touches the neck x 4 mths     Subjective    HPI HPI    Neck Pain    Comments: Intermittent Bilateral neck pain. The pt state when she turns her head it feels like a pulling sensation. She denies any swelling or tenderness when she touches the neck x 4 mths         Last edited by Wilson Singer, CMA on 04/21/2020  9:11 AM. (History)      Patient with a history of multinodular goiter on 2017 ultrasound presents today with 4 months of neck pain. Reports intermittent neck pain that doesn't seem to follow a pattern. Denies sore throat, fevers, chills, nausea, vomiting. Denies choking or coughing sensation.      Medications: Outpatient Medications Prior to Visit  Medication Sig  . Prenat-Fe Carbonyl-FA-Omega 3 (ONE-A-DAY WOMENS PRENATAL 1) 28-0.8-235 MG CAPS Take by mouth.   No facility-administered medications prior to visit.    Review of Systems  All other systems reviewed and are negative.      Objective    BP 111/68 (BP Location: Left Arm, Patient Position: Sitting, Cuff Size: Normal)   Pulse 80   Temp 97.8 F (36.6 C) (Temporal)   Resp 18   Ht 5\' 5"  (1.651 m)   Wt 157 lb 9.6 oz (71.5 kg)   SpO2 100%   BMI 26.23 kg/m     Physical Exam Constitutional:      Appearance: Normal appearance.  HENT:     Mouth/Throat:     Mouth: Mucous membranes are moist.     Pharynx: Oropharynx is clear.  Neck:     Thyroid: Thyromegaly present.  Cardiovascular:     Rate and Rhythm: Normal rate.     Heart sounds: Normal heart sounds.  Pulmonary:     Effort: Pulmonary effort is  normal.     Breath sounds: Normal breath sounds.  Musculoskeletal:     Cervical back: Full passive range of motion without pain.  Lymphadenopathy:     Cervical: No cervical adenopathy.  Neurological:     Mental Status: She is alert and oriented to person, place, and time. Mental status is at baseline.  Psychiatric:        Mood and Affect: Mood normal.        Behavior: Behavior normal.       No results found for any visits on 04/21/20.  Assessment & Plan    1. Neck pain  - TSH - CBC with Differential - Comprehensive Metabolic Panel (CMET) - US Soft Tissue Head/Neck (NON-THYROID); Future  2. Enlarged thyroid  - TSH - US Soft Tissue Head/Neck (NON-THYROID); Future  3. Lipid screening  - Lipid Profile  4. History of skin cancer  - Ambulatory referral to Dermatology    Return if symptoms worsen or fail to improve.         Trinna Post, PA-C  [facility] [phone] (phone) [phone] (fax)  [place]

## 2020-04-22 LAB — COMPREHENSIVE METABOLIC PANEL
AG Ratio: 1.8 (calc) (ref 1.0–2.5)
ALT: 11 U/L (ref 6–29)
AST: 13 U/L (ref 10–30)
Albumin: 4.6 g/dL (ref 3.6–5.1)
Alkaline phosphatase (APISO): 50 U/L (ref 31–125)
BUN: 12 mg/dL (ref 7–25)
CO2: 27 mmol/L (ref 20–32)
Calcium: 9.6 mg/dL (ref 8.6–10.2)
Chloride: 104 mmol/L (ref 98–110)
Creat: 0.85 mg/dL (ref 0.50–1.10)
Globulin: 2.5 g/dL (calc) (ref 1.9–3.7)
Glucose, Bld: 87 mg/dL (ref 65–99)
Potassium: 4.2 mmol/L (ref 3.5–5.3)
Sodium: 138 mmol/L (ref 135–146)
Total Bilirubin: 0.9 mg/dL (ref 0.2–1.2)
Total Protein: 7.1 g/dL (ref 6.1–8.1)

## 2020-04-22 LAB — CBC WITH DIFFERENTIAL/PLATELET
Absolute Monocytes: 288 cells/uL (ref 200–950)
Basophils Absolute: 29 cells/uL (ref 0–200)
Basophils Relative: 0.9 %
Eosinophils Absolute: 29 cells/uL (ref 15–500)
Eosinophils Relative: 0.9 %
HCT: 46.3 % — ABNORMAL HIGH (ref 35.0–45.0)
Hemoglobin: 15.4 g/dL (ref 11.7–15.5)
Lymphs Abs: 1261 cells/uL (ref 850–3900)
MCH: 32.4 pg (ref 27.0–33.0)
MCHC: 33.3 g/dL (ref 32.0–36.0)
MCV: 97.3 fL (ref 80.0–100.0)
MPV: 9.1 fL (ref 7.5–12.5)
Monocytes Relative: 9 %
Neutro Abs: 1594 cells/uL (ref 1500–7800)
Neutrophils Relative %: 49.8 %
Platelets: 238 10*3/uL (ref 140–400)
RBC: 4.76 10*6/uL (ref 3.80–5.10)
RDW: 12.2 % (ref 11.0–15.0)
Total Lymphocyte: 39.4 %
WBC: 3.2 10*3/uL — ABNORMAL LOW (ref 3.8–10.8)

## 2020-04-22 LAB — LIPID PANEL
Cholesterol: 216 mg/dL — ABNORMAL HIGH (ref <200)
HDL: 61 mg/dL (ref >=50)
LDL Cholesterol (Calc): 140 mg/dL (calc) — ABNORMAL HIGH
Non-HDL Cholesterol (Calc): 155 mg/dL (calc) — ABNORMAL HIGH (ref <130)
Total CHOL/HDL Ratio: 3.5 (calc) (ref <5.0)
Triglycerides: 59 mg/dL (ref <150)

## 2020-04-22 LAB — TSH: TSH: 0.57 mIU/L

## 2020-05-20 ENCOUNTER — Ambulatory Visit
Admission: RE | Admit: 2020-05-20 | Discharge: 2020-05-20 | Disposition: A | Discharge status: Home / Self Care | Payer: BC Managed Care – PPO | Source: Ambulatory Visit | Attending: Physician Assistant | Admitting: Physician Assistant

## 2020-05-20 ENCOUNTER — Other Ambulatory Visit: Payer: Self-pay

## 2020-05-20 DIAGNOSIS — E042 Nontoxic multinodular goiter: Secondary | ICD-10-CM | POA: Diagnosis not present

## 2020-05-20 DIAGNOSIS — E049 Nontoxic goiter, unspecified: Secondary | ICD-10-CM | POA: Insufficient documentation

## 2020-05-20 DIAGNOSIS — M542 Cervicalgia: Secondary | ICD-10-CM | POA: Diagnosis not present

## 2020-10-20 ENCOUNTER — Other Ambulatory Visit: Payer: Self-pay | Admitting: Family Medicine

## 2020-10-20 ENCOUNTER — Ambulatory Visit (INDEPENDENT_AMBULATORY_CARE_PROVIDER_SITE_OTHER): Payer: BC Managed Care – PPO | Admitting: Dermatology

## 2020-10-20 ENCOUNTER — Other Ambulatory Visit: Payer: Self-pay

## 2020-10-20 DIAGNOSIS — Z1283 Encounter for screening for malignant neoplasm of skin: Secondary | ICD-10-CM | POA: Diagnosis not present

## 2020-10-20 DIAGNOSIS — L814 Other melanin hyperpigmentation: Secondary | ICD-10-CM

## 2020-10-20 DIAGNOSIS — Z85828 Personal history of other malignant neoplasm of skin: Secondary | ICD-10-CM

## 2020-10-20 DIAGNOSIS — L821 Other seborrheic keratosis: Secondary | ICD-10-CM

## 2020-10-20 DIAGNOSIS — L578 Other skin changes due to chronic exposure to nonionizing radiation: Secondary | ICD-10-CM | POA: Diagnosis not present

## 2020-10-20 DIAGNOSIS — D2371 Other benign neoplasm of skin of right lower limb, including hip: Secondary | ICD-10-CM

## 2020-10-20 DIAGNOSIS — C44519 Basal cell carcinoma of skin of other part of trunk: Secondary | ICD-10-CM

## 2020-10-20 DIAGNOSIS — D489 Neoplasm of uncertain behavior, unspecified: Secondary | ICD-10-CM

## 2020-10-20 DIAGNOSIS — D225 Melanocytic nevi of trunk: Secondary | ICD-10-CM | POA: Diagnosis not present

## 2020-10-20 DIAGNOSIS — I8310 Varicose veins of unspecified lower extremity with inflammation: Secondary | ICD-10-CM

## 2020-10-20 DIAGNOSIS — D229 Melanocytic nevi, unspecified: Secondary | ICD-10-CM

## 2020-10-20 DIAGNOSIS — D1800 Hemangioma unspecified site: Secondary | ICD-10-CM

## 2020-10-20 DIAGNOSIS — C4491 Basal cell carcinoma of skin, unspecified: Secondary | ICD-10-CM

## 2020-10-20 HISTORY — DX: Basal cell carcinoma of skin, unspecified: C44.91

## 2020-10-20 NOTE — Patient Instructions (Addendum)
Biopsy Wound Care Instructions  Leave the original bandage on for 24 hours if possible.  If the bandage becomes soaked or soiled before that time, it is OK to remove it and examine the wound.  A small amount of post-operative bleeding is normal.  If excessive bleeding occurs, remove the bandage, place gauze over the site and apply continuous pressure (no peeking) over the area for 30 minutes. If this does not work, please call our clinic as soon as possible or page your doctor if it is after hours.   Once a day, cleanse the wound with soap and water. It is fine to shower. If a thick crust develops you may use a Q-tip dipped into dilute hydrogen peroxide (mix 1:1 with water) to dissolve it.  Hydrogen peroxide can slow the healing process, so use it only as needed.    After washing, apply petroleum jelly (Vaseline) or an antibiotic ointment if your doctor prescribed one for you, followed by a bandage.    For best healing, the wound should be covered with a layer of ointment at all times. If you are not able to keep the area covered with a bandage to hold the ointment in place, this may mean re-applying the ointment several times a day.  Continue this wound care until the wound has healed and is no longer open.   Itching and mild discomfort is normal during the healing process. However, if you develop pain or severe itching, please call our office.   If you have any discomfort, you can take Tylenol (acetaminophen) or ibuprofen as directed on the bottle. (Please do not take these if you have an allergy to them or cannot take them for another reason).  Some redness, tenderness and white or yellow material in the wound is normal healing.  If the area becomes very sore and red, or develops a thick yellow-green material (pus), it may be infected; please notify us.    If you have stitches, return to clinic as directed to have the stitches removed. You will continue wound care for 2-3 days after the stitches  are removed.   Wound healing continues for up to one year following surgery. It is not unusual to experience pain in the scar from time to time during the interval.  If the pain becomes severe or the scar thickens, you should notify the office.    A slight amount of redness in a scar is expected for the first six months.  After six months, the redness will fade and the scar will soften and fade.  The color difference becomes less noticeable with time.  If there are any problems, return for a post-op surgery check at your earliest convenience.  To improve the appearance of the scar, you can use silicone scar gel, cream, or sheets (such as Mederma or Serica) every night for up to one year. These are available over the counter (without a prescription).  Please call our office at [phone] for any questions or concerns.   Melanoma ABCDEs  Melanoma is the most dangerous type of skin cancer, and is the leading cause of death from skin disease.  You are more likely to develop melanoma if you: Have light-colored skin, light-colored eyes, or red or blond hair Spend a lot of time in the sun Tan regularly, either outdoors or in a tanning bed Have had blistering sunburns, especially during childhood Have a close family member who has had a melanoma Have atypical moles or large birthmarks  Early detection of melanoma is key since treatment is typically straightforward and cure rates are extremely high if we catch it early.   The first sign of melanoma is often a change in a mole or a new dark spot.  The ABCDE system is a way of remembering the signs of melanoma.  A for asymmetry:  The two halves do not match. B for border:  The edges of the growth are irregular. C for color:  A mixture of colors are present instead of an even brown color. D for diameter:  Melanomas are usually (but not always) greater than 62mm - the size of a pencil eraser. E for evolution:  The spot keeps changing in size,  shape, and color.  Please check your skin once per month between visits. You can use a small mirror in front and a large mirror behind you to keep an eye on the back side or your body.   If you see any new or changing lesions before your next follow-up, please call to schedule a visit.  Please continue daily skin protection including broad spectrum sunscreen SPF 30+ to sun-exposed areas, reapplying every 2 hours as needed when you're outdoors.   Staying in the shade or wearing long sleeves, sun glasses (UVA+UVB protection) and wide brim hats (4-inch brim around the entire circumference of the hat) are also recommended for sun protection.    If you have any questions or concerns for your doctor, please call our main line at [phone] and press option 4 to reach your doctor's medical assistant. If no one answers, please leave a voicemail as directed and we will return your call as soon as possible. Messages left after 4 pm will be answered the following business day.   You may also send [place] a message via [place]. We typically respond to MyChart messages within 1-2 business days.  For prescription refills, please ask your pharmacy to contact our office. Our fax number is [phone].  If you have an urgent issue when the clinic is closed that cannot wait until the next business day, you can page your doctor at the number below.    Please note that while we do our best to be available for urgent issues outside of office hours, we are not available 24/7.   If you have an urgent issue and are unable to reach [place], you may choose to seek medical care at your doctor's office, retail clinic, urgent care center, or emergency room.  If you have a medical emergency, please immediately call 911 or go to the emergency department.  Pager Numbers  - Dr. Nehemiah Massed: [phone]  - Dr. Laurence Ferrari: [phone]  - Dr. Nicole Kindred: [phone]  In the event of inclement weather, please call our main line at  [phone] for an update on the status of any delays or closures.  Dermatology Medication Tips: Please keep the boxes that topical medications come in in order to help keep track of the instructions about where and how to use these. Pharmacies typically print the medication instructions only on the boxes and not directly on the medication tubes.   If your medication is too expensive, please contact our office at [phone] option 4 or send [place] a message through [place].   We are unable to tell what your co-pay for medications will be in advance as this is different depending on your insurance coverage. However, we may be able to find a substitute medication at lower cost or fill out paperwork to get  insurance to cover a needed medication.   If a prior authorization is required to get your medication covered by your insurance company, please allow [place] 1-2 business days to complete this process.  Drug prices often vary depending on where the prescription is filled and some pharmacies may offer cheaper prices.  The website www.goodrx.com contains coupons for medications through different pharmacies. The prices here do not account for what the cost may be with help from insurance (it may be cheaper with your insurance), but the website can give you the price if you did not use any insurance.  - You can print the associated coupon and take it with your prescription to the pharmacy.  - You may also stop by our office during regular business hours and pick up a GoodRx coupon card.  - If you need your prescription sent electronically to a different pharmacy, notify our office through [facility] or by phone at [phone] option 4.

## 2020-10-20 NOTE — Progress Notes (Signed)
New Patient Visit  Subjective  Vicki Walters is a 45 y.o. female who presents for the following: New Patient (Initial Visit) (Patient here today as a new patient and would like to establish care. She is here for full body check. Reports history of bcc in 2019 and had 2 areas removed. Patient states she noticed 6 months ago a pink spot at her left chest at same site as bcc site in 2019. ).  Patient here for full body skin exam and skin cancer screening. Pt has difficulty hearing and female companion present with her translates for her.  The following portions of the chart were reviewed this encounter and updated as appropriate:   Tobacco  Allergies  Meds  Problems  Med Hx  Surg Hx  Fam Hx     Review of Systems:  No other skin or systemic complaints except as noted in HPI or Assessment and Plan.  Objective  Well appearing patient in no apparent distress; mood and affect are within normal limits.  A full examination was performed including scalp, head, eyes, ears, nose, lips, neck, chest, axillae, abdomen, back, buttocks, bilateral upper extremities, bilateral lower extremities, hands, feet, fingers, toes, fingernails, and toenails. All findings within normal limits unless otherwise noted below.  left chest 1.1 cm x 0.7 cm pink patch within scar       right upper back medial inferior to scapula 1.5 x 0.7 cm irregular brown flat papule        right proximal posterior thigh 0.6 irregular brown flat papule        Assessment & Plan  Neoplasm of uncertain behavior (3) left chest  Skin / nail biopsy Type of biopsy: tangential   Informed consent: discussed and consent obtained   Timeout: patient name, date of birth, surgical site, and procedure verified   Procedure prep:  Patient was prepped and draped in usual sterile fashion Prep type:  Isopropyl alcohol Anesthesia: the lesion was anesthetized in a standard fashion   Anesthetic:  1% lidocaine w/ epinephrine  1-100,000 buffered w/ 8.4% NaHCO3 Instrument used: flexible razor blade   Hemostasis achieved with: pressure, aluminum chloride and electrodesiccation   Outcome: patient tolerated procedure well   Post-procedure details: sterile dressing applied and wound care instructions given   Dressing type: petrolatum and bandage    Specimen 1 - Surgical pathology Differential Diagnosis: r/o recurrent bcc   Check Margins: No R/o recurrent bcc at left chest  Patient reports history of bcc treated and removed in 2019  Irregular moles at right upper back medial inferior to scapula  and right proximal posterior thigh plan to remove in future  Irregular nevi of: right upper back medial inferior to scapula and right proximal posterior thigh Plan removal in future.  Lentigines - Scattered tan macules - Due to sun exposure - Benign-appearing, observe - Recommend daily broad spectrum sunscreen SPF 30+ to sun-exposed areas, reapply every 2 hours as needed. - Call for any changes  Seborrheic Keratoses - Stuck-on, waxy, tan-brown papules and/or plaques  - Benign-appearing - Discussed benign etiology and prognosis. - Observe - Call for any changes  Dermatofibroma - Firm pink/brown papulenodule with dimple sign right medial thigh and right posterior thigh - Benign appearing - Call for any changes  Varicose Veins/Spider Veins - Dilated blue, purple or red veins at the lower extremities - Reassured - Smaller vessels can be treated by sclerotherapy (a procedure to inject a medicine into the veins to make them disappear) if desired, but  the treatment is not covered by insurance. Larger vessels may be covered if symptomatic and we would refer to vascular surgeon if treatment desired.  Melanocytic Nevi - Tan-brown and/or pink-flesh-colored symmetric macules and papules - Benign appearing on exam today - Observation - Call clinic for new or changing moles - Recommend daily use of broad spectrum spf  30+ sunscreen to sun-exposed areas.   Hemangiomas - Red papules - Discussed benign nature - Observe - Call for any changes  Actinic Damage - Chronic condition, secondary to cumulative UV/sun exposure - diffuse scaly erythematous macules with underlying dyspigmentation - Recommend daily broad spectrum sunscreen SPF 30+ to sun-exposed areas, reapply every 2 hours as needed.  - Staying in the shade or wearing long sleeves, sun glasses (UVA+UVB protection) and wide brim hats (4-inch brim around the entire circumference of the hat) are also recommended for sun protection.  - Call for new or changing lesions.  Skin cancer screening performed today.  Return for january follow up . IRuthell Rummage, CMA, am acting as scribe for Sarina Ser, MD.  Documentation: I have reviewed the above documentation for accuracy and completeness, and I agree with the above.  Sarina Ser, MD

## 2020-10-21 ENCOUNTER — Encounter: Payer: Self-pay | Admitting: Dermatology

## 2020-10-25 ENCOUNTER — Telehealth: Payer: Self-pay

## 2020-10-25 NOTE — Telephone Encounter (Signed)
Left pt message to call for bx results/sh °

## 2020-10-25 NOTE — Telephone Encounter (Signed)
-----   Message from Ralene Bathe, MD sent at 10/25/2020  9:33 AM EDT ----- Diagnosis Skin , left chest SUPERFICIAL BASAL CELL CARCINOMA  Cancer - BCC Superficial Recurrent Schedule for treatment ([facility]).

## 2020-10-26 ENCOUNTER — Telehealth: Payer: Self-pay

## 2020-10-26 ENCOUNTER — Ambulatory Visit (INDEPENDENT_AMBULATORY_CARE_PROVIDER_SITE_OTHER): Payer: BC Managed Care – PPO | Admitting: Internal Medicine

## 2020-10-26 ENCOUNTER — Other Ambulatory Visit: Payer: Self-pay

## 2020-10-26 ENCOUNTER — Encounter: Payer: Self-pay | Admitting: Internal Medicine

## 2020-10-26 VITALS — BP 107/63 | Temp 97.7°F | Resp 18 | Ht 65.0 in | Wt 143.0 lb

## 2020-10-26 DIAGNOSIS — L301 Dyshidrosis [pompholyx]: Secondary | ICD-10-CM | POA: Diagnosis not present

## 2020-10-26 DIAGNOSIS — Z1231 Encounter for screening mammogram for malignant neoplasm of breast: Secondary | ICD-10-CM

## 2020-10-26 DIAGNOSIS — E7800 Pure hypercholesterolemia, unspecified: Secondary | ICD-10-CM | POA: Diagnosis not present

## 2020-10-26 MED ORDER — MOMETASONE FUROATE 0.1 % EX OINT: TOPICAL_OINTMENT | Freq: Every day | CUTANEOUS | 0 refills | Status: DC

## 2020-10-26 NOTE — Telephone Encounter (Signed)
Patient's spouse left voicemail to call back with results. (On hippa) Called spouse but had to leave second msg. aw

## 2020-10-26 NOTE — Assessment & Plan Note (Deleted)
Rx for Mometasone ointment Avoid washing your hands in hot water Apply lotion daily

## 2020-10-26 NOTE — Telephone Encounter (Signed)
-----   Message from Ralene Bathe, MD sent at 10/25/2020  9:33 AM EDT ----- Diagnosis Skin , left chest SUPERFICIAL BASAL CELL CARCINOMA  Cancer - BCC Superficial Recurrent Schedule for treatment ([facility]).

## 2020-10-26 NOTE — Assessment & Plan Note (Signed)
Rx for Mometasone ointment Avoid washing your hands in hot water Apply lotion daily

## 2020-10-26 NOTE — Patient Instructions (Signed)
Heart-Healthy Eating Plan Heart-healthy meal planning includes: Eating less unhealthy fats. Eating more healthy fats. Making other changes in your diet. Talk with your doctor or a diet specialist (dietitian) to create an eating plan that is right for you. What is my plan? Your doctor may recommend an eating plan that includes: Total fat: ______% or less of total calories a day. Saturated fat: ______% or less of total calories a day. Cholesterol: less than _________mg a day. What are tips for following this plan? Cooking Avoid frying your food. Try to bake, boil, grill, or broil it instead. You can also reduce fat by: Removing the skin from poultry. Removing all visible fats from meats. Steaming vegetables in water or broth. Meal planning  At meals, divide your plate into four equal parts: Fill one-half of your plate with vegetables and green salads. Fill one-fourth of your plate with whole grains. Fill one-fourth of your plate with lean protein foods. Eat 4-5 servings of vegetables per day. A serving of vegetables is: 1 cup of raw or cooked vegetables. 2 cups of raw leafy greens. Eat 4-5 servings of fruit per day. A serving of fruit is: 1 medium whole fruit.  cup of dried fruit.  cup of fresh, frozen, or canned fruit.  cup of 100% fruit juice. Eat more foods that have soluble fiber. These are apples, broccoli, carrots, beans, peas, and barley. Try to get 20-30 g of fiber per day. Eat 4-5 servings of nuts, legumes, and seeds per week: 1 serving of dried beans or legumes equals  cup after being cooked. 1 serving of nuts is  cup. 1 serving of seeds equals 1 tablespoon. General information Eat more home-cooked food. Eat less restaurant, buffet, and fast food. Limit or avoid alcohol. Limit foods that are high in starch and sugar. Avoid fried foods. Lose weight if you are overweight. Keep track of how much salt (sodium) you eat. This is important if you have high blood  pressure. Ask your doctor to tell you more about this. Try to add vegetarian meals each week. Fats Choose healthy fats. These include olive oil and canola oil, flaxseeds, walnuts, almonds, and seeds. Eat more omega-3 fats. These include salmon, mackerel, sardines, tuna, flaxseed oil, and ground flaxseeds. Try to eat fish at least 2 times each week. Check food labels. Avoid foods with trans fats or high amounts of saturated fat. Limit saturated fats. These are often found in animal products, such as meats, butter, and cream. These are also found in plant foods, such as palm oil, palm kernel oil, and coconut oil. Avoid foods with partially hydrogenated oils in them. These have trans fats. Examples are stick margarine, some tub margarines, cookies, crackers, and other baked goods. What foods can I eat? Fruits All fresh, canned (in natural juice), or frozen fruits. Vegetables Fresh or frozen vegetables (raw, steamed, roasted, or grilled). Green salads. Grains Most grains. Choose whole wheat and whole grains most of the time. Rice and pasta, including brown rice and pastas made with whole wheat. Meats and other proteins Lean, well-trimmed beef, veal, pork, and lamb. Chicken and turkey without skin. All fish and shellfish. Wild duck, rabbit, pheasant, and venison. Egg whites or low-cholesterol egg substitutes. Dried beans, peas, lentils, and tofu. Seeds and most nuts. Dairy Low-fat or nonfat cheeses, including ricotta and mozzarella. Skim or 1% milk that is liquid, powdered, or evaporated. Buttermilk that is made with low-fat milk. Nonfat or low-fat yogurt. Fats and oils Non-hydrogenated (trans-free) margarines. Vegetable oils, including   soybean, sesame, sunflower, olive, peanut, safflower, corn, canola, and cottonseed. Salad dressings or mayonnaise made with a vegetable oil. Beverages Mineral water. Coffee and tea. Diet carbonated beverages. Sweets and desserts Sherbet, gelatin, and fruit ice.  Small amounts of dark chocolate. Limit all sweets and desserts. Seasonings and condiments All seasonings and condiments. The items listed above may not be a complete list of foods and drinks you can eat. Contact a dietitian for more options. What foods should I avoid? Fruits Canned fruit in heavy syrup. Fruit in cream or butter sauce. Fried fruit. Limit coconut. Vegetables Vegetables cooked in cheese, cream, or butter sauce. Fried vegetables. Grains Breads that are made with saturated or trans fats, oils, or whole milk. Croissants. Sweet rolls. Donuts. High-fat crackers, such as cheese crackers. Meats and other proteins Fatty meats, such as hot dogs, ribs, sausage, bacon, rib-eye roast or steak. High-fat deli meats, such as salami and bologna. Caviar. Domestic duck and goose. Organ meats, such as liver. Dairy Cream, sour cream, cream cheese, and creamed cottage cheese. Whole-milk cheeses. Whole or 2% milk that is liquid, evaporated, or condensed. Whole buttermilk. Cream sauce or high-fat cheese sauce. Yogurt that is made from whole milk. Fats and oils Meat fat, or shortening. Cocoa butter, hydrogenated oils, palm oil, coconut oil, palm kernel oil. Solid fats and shortenings, including bacon fat, salt pork, lard, and butter. Nondairy cream substitutes. Salad dressings with cheese or sour cream. Beverages Regular sodas and juice drinks with added sugar. Sweets and desserts Frosting. Pudding. Cookies. Cakes. Pies. Milk chocolate or white chocolate. Buttered syrups. Full-fat ice cream or ice cream drinks. The items listed above may not be a complete list of foods and drinks to avoid. Contact a dietitian for more information. Summary Heart-healthy meal planning includes eating less unhealthy fats, eating more healthy fats, and making other changes in your diet. Eat a balanced diet. This includes fruits and vegetables, low-fat or nonfat dairy, lean protein, nuts and legumes, whole grains, and  heart-healthy oils and fats. This information is not intended to replace advice given to you by your health care provider. Make sure you discuss any questions you have with your health care provider. Document Revised: 05/19/2020 Document Reviewed: 05/19/2020 Elsevier Patient Education  2022 [employer]  

## 2020-10-26 NOTE — Progress Notes (Signed)
Subjective:    Patient ID: Vicki Walters, female    DOB: 25-Dec-1975, 45 y.o.   MRN: [id number]  HPI  Patient presents the clinic today for follow-up of chronic conditions.  She is establishing care with me today, transferring care from Lonie Peak, NP.  Eczema: She is not using any steroid creams at this time.  She does not follow with dermatology.  HLD: Her last LDL was 140, triglycerides 59, 03/2020.  She is not taking any medications for this.  She tries to consume a low-fat diet.  She also requests referral for mammogram.  She reports her twin sister has had breast cancer.  Review of Systems     Past Medical History:  Diagnosis Date   Basal cell carcinoma 10/20/2020   BCC recurrent, needs EDC   GERD (gastroesophageal reflux disease)    Thyroid disease     Current Outpatient Medications  Medication Sig Dispense Refill   Prenat-Fe Carbonyl-FA-Omega 3 (ONE-A-DAY WOMENS PRENATAL 1) 28-0.8-235 MG CAPS Take by mouth. (Patient not taking: Reported on 10/20/2020)     No current facility-administered medications for this visit.    No Known Allergies  Family History  Problem Relation Age of Onset   Heart disease Maternal Grandmother    Heart attack Maternal Grandmother    Cancer Maternal Grandfather    Diabetes Paternal Grandfather    Breast cancer Sister 14    Social History   Socioeconomic History   Marital status: Married    Spouse name: Not on file   Number of children: Not on file   Years of education: Not on file   Highest education level: Not on file  Occupational History   Not on file  Tobacco Use   Smoking status: Former    Packs/day: 0.50    Years: 10.00    Pack years: 5.00    Types: Cigarettes    Quit date: 03/10/2007    Years since quitting: 13.6   Smokeless tobacco: Never  Vaping Use   Vaping Use: Never used  Substance and Sexual Activity   Alcohol use: Yes    Comment: occasional    Drug use: No   Sexual activity: Yes  Other Topics  Concern   Not on file  Social History Narrative   Not on file   Social Determinants of Health   Financial Resource Strain: Not on file  Food Insecurity: Not on file  Transportation Needs: Not on file  Physical Activity: Not on file  Stress: Not on file  Social Connections: Not on file  Intimate Partner Violence: Not on file     Constitutional: Denies fever, malaise, fatigue, headache or abrupt weight changes.  HEENT: Patient is HOH-deaf.  Denies eye pain, eye redness, ear pain, ringing in the ears, wax buildup, runny nose, nasal congestion, bloody nose, or sore throat. Respiratory: Denies difficulty breathing, shortness of breath, cough or sputum production.   Cardiovascular: Denies chest pain, chest tightness, palpitations or swelling in the hands or feet.  Gastrointestinal: Denies abdominal pain, bloating, constipation, diarrhea or blood in the stool.  GU: Denies urgency, frequency, pain with urination, burning sensation, blood in urine, odor or discharge. Musculoskeletal: Denies decrease in range of motion, difficulty with gait, muscle pain or joint pain and swelling.  Skin: Patient reports extremely dry skin on her hands.  Denies redness, lesions or ulcercations.  Neurological: Denies dizziness, difficulty with memory, difficulty with speech or problems with balance and coordination.  Psych: Denies anxiety, depression, SI/HI.  No other  specific complaints in a complete review of systems (except as listed in HPI above).  Objective:   Physical Exam   BP 107/63 (BP Location: Right Arm, Patient Position: Sitting, Cuff Size: Normal)   Temp 97.7 F (36.5 C) (Temporal)   Resp 18   Ht 5\' 5"  (1.651 m)   Wt 143 lb (64.9 kg)   LMP 10/11/2020   SpO2 98%   BMI 23.80 kg/m   Wt Readings from Last 3 Encounters:  04/21/20 157 lb 9.6 oz (71.5 kg)  09/04/19 157 lb (71.2 kg)  06/12/19 154 lb (69.9 kg)    General: Appears her stated age, well developed, well nourished in NAD. Skin:  Warm, dry and intact.  Dyshidrotic eczema noted of bilateral hands. HEENT: Head: normal shape and size; Eyes: sclera white and EOMs intact; Ears: Wearing hearing aids;  Cardiovascular: Normal rate and rhythm. S1,S2 noted.  No murmur, rubs or gallops noted.  Pulmonary/Chest: Normal effort and positive vesicular breath sounds. No respiratory distress. No wheezes, rales or ronchi noted.  Neurological: Alert and oriented.   Psychiatric: Mood and affect normal. Behavior is normal. Judgment and thought content normal.     BMET    Component Value Date/Time   NA 138 04/21/2020 0932   NA 141 11/19/2018 0946   NA 135 (L) 06/18/2013 2001   K 4.2 04/21/2020 0932   K 3.3 (L) 06/18/2013 2001   CL 104 04/21/2020 0932   CL 99 06/18/2013 2001   CO2 27 04/21/2020 0932   CO2 27 06/18/2013 2001   GLUCOSE 87 04/21/2020 0932   GLUCOSE 91 06/18/2013 2001   BUN 12 04/21/2020 0932   BUN 17 11/19/2018 0946   BUN 13 06/18/2013 2001   CREATININE 0.85 04/21/2020 0932   CALCIUM 9.6 04/21/2020 0932   CALCIUM 9.6 06/18/2013 2001   GFRNONAA 92 11/19/2018 0946   GFRNONAA 90 05/29/2018 1048   GFRAA 106 11/19/2018 0946   GFRAA 105 05/29/2018 1048    Lipid Panel     Component Value Date/Time   CHOL 216 (H) 04/21/2020 0932   CHOL 240 (H) 03/11/2015 1347   TRIG 59 04/21/2020 0932   HDL 61 04/21/2020 0932   HDL 70 03/11/2015 1347   CHOLHDL 3.5 04/21/2020 0932   LDLCALC 140 (H) 04/21/2020 0932    CBC    Component Value Date/Time   WBC 3.2 (L) 04/21/2020 0932   RBC 4.76 04/21/2020 0932   HGB 15.4 04/21/2020 0932   HGB 14.6 06/01/2015 0834   HCT 46.3 (H) 04/21/2020 0932   HCT 42.7 06/01/2015 0834   PLT 238 04/21/2020 0932   PLT 232 06/01/2015 0834   MCV 97.3 04/21/2020 0932   MCV 96 06/01/2015 0834   MCV 97 06/18/2013 2001   MCH 32.4 04/21/2020 0932   MCHC 33.3 04/21/2020 0932   RDW 12.2 04/21/2020 0932   RDW 13.7 06/01/2015 0834   RDW 12.6 06/18/2013 2001   LYMPHSABS 1,261 04/21/2020 0932    LYMPHSABS 1.6 06/01/2015 0834   MONOABS 0.7 04/25/2016 0830   EOSABS 29 04/21/2020 0932   EOSABS 0.0 06/01/2015 0834   BASOSABS 29 04/21/2020 0932   BASOSABS 0.0 06/01/2015 0834    Hgb A1C Lab Results  Component Value Date   HGBA1C 5.3 10/01/2018           Assessment & Plan:  Screening for Breast Cancer:  Mammogram ordered, she will call to schedule  RTC in 1 year for your annual exam   Webb Silversmith, NP  This visit occurred during the SARS-CoV-2 public health emergency.  Safety protocols were in place, including screening questions prior to the visit, additional usage of staff PPE, and extensive cleaning of exam room while observing appropriate contact time as indicated for disinfecting solutions.

## 2020-10-26 NOTE — Assessment & Plan Note (Signed)
Lipid profile today Encouraged her to consume a low-fat diet

## 2020-10-27 ENCOUNTER — Telehealth: Payer: Self-pay

## 2020-10-27 LAB — LIPID PANEL
Cholesterol: 204 mg/dL — ABNORMAL HIGH (ref <200)
HDL: 63 mg/dL (ref >=50)
LDL Cholesterol (Calc): 124 mg/dL (calc) — ABNORMAL HIGH
Non-HDL Cholesterol (Calc): 141 mg/dL (calc) — ABNORMAL HIGH (ref <130)
Total CHOL/HDL Ratio: 3.2 (calc) (ref <5.0)
Triglycerides: 75 mg/dL (ref <150)

## 2020-10-27 NOTE — Telephone Encounter (Signed)
Advised patient of results/hd  

## 2020-10-27 NOTE — Telephone Encounter (Signed)
-----   Message from Ralene Bathe, MD sent at 10/25/2020  9:33 AM EDT ----- Diagnosis Skin , left chest SUPERFICIAL BASAL CELL CARCINOMA  Cancer - BCC Superficial Recurrent Schedule for treatment ([facility]).

## 2020-10-31 ENCOUNTER — Ambulatory Visit: Payer: BC Managed Care – PPO | Admitting: Dermatology

## 2020-11-23 DIAGNOSIS — J0100 Acute maxillary sinusitis, unspecified: Secondary | ICD-10-CM | POA: Diagnosis not present

## 2020-11-23 DIAGNOSIS — J029 Acute pharyngitis, unspecified: Secondary | ICD-10-CM | POA: Diagnosis not present

## 2020-12-05 ENCOUNTER — Other Ambulatory Visit: Payer: Self-pay

## 2020-12-05 ENCOUNTER — Ambulatory Visit (INDEPENDENT_AMBULATORY_CARE_PROVIDER_SITE_OTHER): Payer: BC Managed Care – PPO | Admitting: Dermatology

## 2020-12-05 DIAGNOSIS — C44519 Basal cell carcinoma of skin of other part of trunk: Secondary | ICD-10-CM | POA: Diagnosis not present

## 2020-12-05 NOTE — Progress Notes (Signed)
   Follow-Up Visit   Subjective  Vicki Walters is a 45 y.o. female who presents for the following: Procedure (Patient here today to treat biopsy proven BCC at left chest. ).  BCC is superficial and recurrent.   The following portions of the chart were reviewed this encounter and updated as appropriate:   Tobacco  Allergies  Meds  Problems  Med Hx  Surg Hx  Fam Hx     Review of Systems:  No other skin or systemic complaints except as noted in HPI or Assessment and Plan.  Objective  Well appearing patient in no apparent distress; mood and affect are within normal limits.  A focused examination was performed including chest. Relevant physical exam findings are noted in the Assessment and Plan.  left chest Pink healing biopsy site  Assessment & Plan  Basal cell carcinoma (BCC) of skin of other part of torso left chest  Destruction of lesion Complexity: extensive   Destruction method: electrodesiccation and curettage   Informed consent: discussed and consent obtained   Timeout:  patient name, date of birth, surgical site, and procedure verified Procedure prep:  Patient was prepped and draped in usual sterile fashion Prep type:  Isopropyl alcohol Anesthesia: the lesion was anesthetized in a standard fashion   Anesthetic:  1% lidocaine w/ epinephrine 1-100,000 buffered w/ 8.4% NaHCO3 Curettage performed in three different directions: Yes   Electrodesiccation performed over the curetted area: Yes   Final wound size (cm):  2.2 Hemostasis achieved with:  pressure, aluminum chloride and electrodesiccation Outcome: patient tolerated procedure well with no complications   Post-procedure details: sterile dressing applied and wound care instructions given   Dressing type: bandage and petrolatum    Return for as scheduled for nevi recheck, check fingernails.  Graciella Belton, RMA, am acting as scribe for Sarina Ser, MD . Documentation: I have reviewed the above  documentation for accuracy and completeness, and I agree with the above.  Sarina Ser, MD

## 2020-12-05 NOTE — Patient Instructions (Addendum)
Wound Care Instructions  Cleanse wound gently with soap and water once a day then pat dry with clean gauze. Apply a thing coat of Petrolatum (petroleum jelly, "Vaseline") over the wound (unless you have an allergy to this). We recommend that you use a new, sterile tube of Vaseline. Do not pick or remove scabs. Do not remove the yellow or white "healing tissue" from the base of the wound.  Cover the wound with fresh, clean, nonstick gauze and secure with paper tape. You may use Band-Aids in place of gauze and tape if the would is small enough, but would recommend trimming much of the tape off as there is often too much. Sometimes Band-Aids can irritate the skin.  You should call the office for your biopsy report after 1 week if you have not already been contacted.  If you experience any problems, such as abnormal amounts of bleeding, swelling, significant bruising, significant pain, or evidence of infection, please call the office immediately.  FOR ADULT SURGERY PATIENTS: If you need something for pain relief you may take 1 extra strength Tylenol (acetaminophen) AND 2 Ibuprofen (200mg each) together every 4 hours as needed for pain. (do not take these if you are allergic to them or if you have a reason you should not take them.) Typically, you may only need pain medication for 1 to 3 days.   If you have any questions or concerns for your doctor, please call our main line at [phone] and press option 4 to reach your doctor's medical assistant. If no one answers, please leave a voicemail as directed and we will return your call as soon as possible. Messages left after 4 pm will be answered the following business day.   You may also send us a message via MyChart. We typically respond to MyChart messages within 1-2 business days.  For prescription refills, please ask your pharmacy to contact our office. Our fax number is [phone].  If you have an urgent issue when the clinic is closed that  cannot wait until the next business day, you can page your doctor at the number below.    Please note that while we do our best to be available for urgent issues outside of office hours, we are not available 24/7.   If you have an urgent issue and are unable to reach us, you may choose to seek medical care at your doctor's office, retail clinic, urgent care center, or emergency room.  If you have a medical emergency, please immediately call 911 or go to the emergency department.  Pager Numbers  - Dr. Kowalski: [phone]  - Dr. Moye: [phone]  - Dr. Stewart: [phone]  In the event of inclement weather, please call our main line at [phone] for an update on the status of any delays or closures.  Dermatology Medication Tips: Please keep the boxes that topical medications come in in order to help keep track of the instructions about where and how to use these. Pharmacies typically print the medication instructions only on the boxes and not directly on the medication tubes.   If your medication is too expensive, please contact our office at [phone] option 4 or send us a message through MyChart.   We are unable to tell what your co-pay for medications will be in advance as this is different depending on your insurance coverage. However, we may be able to find a substitute medication at lower cost or fill out paperwork to get insurance to cover a needed   medication.   If a prior authorization is required to get your medication covered by your insurance company, please allow us 1-2 business days to complete this process.  Drug prices often vary depending on where the prescription is filled and some pharmacies may offer cheaper prices.  The website www.goodrx.com contains coupons for medications through different pharmacies. The prices here do not account for what the cost may be with help from insurance (it may be cheaper with your insurance), but the website can give you the  price if you did not use any insurance.  - You can print the associated coupon and take it with your prescription to the pharmacy.  - You may also stop by our office during regular business hours and pick up a GoodRx coupon card.  - If you need your prescription sent electronically to a different pharmacy, notify our office through [place] MyChart or by phone at [phone] option 4.   

## 2020-12-08 ENCOUNTER — Ambulatory Visit
Admission: RE | Admit: 2020-12-08 | Discharge: 2020-12-08 | Disposition: A | Discharge status: Home / Self Care | Payer: BC Managed Care – PPO | Source: Ambulatory Visit | Attending: Internal Medicine | Admitting: Internal Medicine

## 2020-12-08 ENCOUNTER — Other Ambulatory Visit: Payer: Self-pay

## 2020-12-08 DIAGNOSIS — Z1231 Encounter for screening mammogram for malignant neoplasm of breast: Secondary | ICD-10-CM | POA: Insufficient documentation

## 2020-12-11 ENCOUNTER — Encounter: Payer: Self-pay | Admitting: Dermatology

## 2021-02-02 ENCOUNTER — Other Ambulatory Visit: Payer: Self-pay

## 2021-02-02 ENCOUNTER — Ambulatory Visit (INDEPENDENT_AMBULATORY_CARE_PROVIDER_SITE_OTHER): Payer: BC Managed Care – PPO | Admitting: Dermatology

## 2021-02-02 DIAGNOSIS — L308 Other specified dermatitis: Secondary | ICD-10-CM

## 2021-02-02 DIAGNOSIS — Z85828 Personal history of other malignant neoplasm of skin: Secondary | ICD-10-CM

## 2021-02-02 DIAGNOSIS — Z1283 Encounter for screening for malignant neoplasm of skin: Secondary | ICD-10-CM | POA: Diagnosis not present

## 2021-02-02 DIAGNOSIS — L814 Other melanin hyperpigmentation: Secondary | ICD-10-CM

## 2021-02-02 DIAGNOSIS — D1800 Hemangioma unspecified site: Secondary | ICD-10-CM

## 2021-02-02 DIAGNOSIS — L821 Other seborrheic keratosis: Secondary | ICD-10-CM

## 2021-02-02 DIAGNOSIS — D229 Melanocytic nevi, unspecified: Secondary | ICD-10-CM | POA: Diagnosis not present

## 2021-02-02 DIAGNOSIS — D225 Melanocytic nevi of trunk: Secondary | ICD-10-CM

## 2021-02-02 DIAGNOSIS — L578 Other skin changes due to chronic exposure to nonionizing radiation: Secondary | ICD-10-CM

## 2021-02-02 MED ORDER — EUCRISA 2 % EX OINT: TOPICAL_OINTMENT | CUTANEOUS | 2 refills | Status: DC

## 2021-02-02 NOTE — Patient Instructions (Addendum)
Gentle Skin Care Guide  1. Bathe no more than once a day.  2. Avoid bathing in hot water  3. Use a mild soap like Dove, Vanicream, Cetaphil, CeraVe. Can use Lever 2000 or Cetaphil antibacterial soap  4. Use soap only where you need it. On most days, use it under your arms, between your legs, and on your feet. Let the water rinse other areas unless visibly dirty.  5. When you get out of the bath/shower, use a towel to gently blot your skin dry, don't rub it.  6. While your skin is still a little damp, apply a moisturizing cream CeraVe cream.  7. Reapply moisturizer any time you start to itch or feel dry.  8. Sometimes using free and clear laundry detergents can be helpful. Fabric softener sheets should be avoided. Downy Free & Gentle liquid, or any liquid fabric softener that is free of dyes and perfumes, it acceptable to use  9. If your doctor has given you prescription creams you may apply moisturizers over them        If You Need Anything After Your Visit  If you have any questions or concerns for your doctor, please call our main line at [phone] and press option 4 to reach your doctor's medical assistant. If no one answers, please leave a voicemail as directed and we will return your call as soon as possible. Messages left after 4 pm will be answered the following business day.   You may also send [place] a message via [place]. We typically respond to MyChart messages within 1-2 business days.  For prescription refills, please ask your pharmacy to contact our office. Our fax number is [phone].  If you have an urgent issue when the clinic is closed that cannot wait until the next business day, you can page your doctor at the number below.    Please note that while we do our best to be available for urgent issues outside of office hours, we are not available 24/7.   If you have an urgent issue and are unable to reach [place], you may choose to seek medical care at your doctor's  office, retail clinic, urgent care center, or emergency room.  If you have a medical emergency, please immediately call 911 or go to the emergency department.  Pager Numbers  - Dr. Nehemiah Massed: [phone]  - Dr. Laurence Ferrari: [phone]  - Dr. Nicole Kindred: [phone]  In the event of inclement weather, please call our main line at [phone] for an update on the status of any delays or closures.  Dermatology Medication Tips: Please keep the boxes that topical medications come in in order to help keep track of the instructions about where and how to use these. Pharmacies typically print the medication instructions only on the boxes and not directly on the medication tubes.   If your medication is too expensive, please contact our office at [phone] option 4 or send [place] a message through [place].   We are unable to tell what your co-pay for medications will be in advance as this is different depending on your insurance coverage. However, we may be able to find a substitute medication at lower cost or fill out paperwork to get insurance to cover a needed medication.   If a prior authorization is required to get your medication covered by your insurance company, please allow [place] 1-2 business days to complete this process.  Drug prices often vary depending on where the prescription is filled and some pharmacies may  offer cheaper prices.  The website www.goodrx.com contains coupons for medications through different pharmacies. The prices here do not account for what the cost may be with help from insurance (it may be cheaper with your insurance), but the website can give you the price if you did not use any insurance.  - You can print the associated coupon and take it with your prescription to the pharmacy.  - You may also stop by our office during regular business hours and pick up a GoodRx coupon card.  - If you need your prescription sent electronically to a different pharmacy, notify our office  through [facility] or by phone at [phone] option 4.     Si Usted Necesita Algo Despus de Su Visita  Tambin puede enviarnos un mensaje a travs de Pharmacist, community. Por lo general respondemos a los mensajes de MyChart en el transcurso de 1 a 2 das hbiles.  Para renovar recetas, por favor pida a su farmacia que se ponga en contacto con nuestra oficina. Harland Dingwall de fax es [place] [phone].  Si tiene un asunto urgente cuando la clnica est cerrada y que no puede esperar hasta el siguiente da hbil, puede llamar/localizar a su doctor(a) al nmero que aparece a continuacin.   Por favor, tenga en cuenta que aunque hacemos todo lo posible para estar disponibles para asuntos urgentes fuera del horario de [place], no estamos disponibles las 24 horas del da, los 7 das de la [place].   Si tiene un problema urgente y no puede comunicarse con nosotros, puede optar por buscar atencin mdica  en el consultorio de su doctor(a), en una clnica privada, en un centro de atencin urgente o en una sala de emergencias.  Si tiene Engineering geologist, por favor llame inmediatamente al 911 o vaya a la sala de emergencias.  Nmeros de bper  - Dr. Nehemiah Massed: [phone]  - Dra. Moye: [phone]  - Dra. Nicole Kindred: [phone]  En caso de inclemencias del [place], por favor llame a Johnsie Kindred principal al [phone] para una actualizacin sobre el [place] de cualquier retraso o cierre.  Consejos para la medicacin en dermatologa: Por favor, guarde las cajas en las que vienen los medicamentos de uso tpico para ayudarle a seguir las instrucciones sobre dnde y cmo usarlos. Las farmacias generalmente imprimen las instrucciones del medicamento slo en las cajas y no directamente en los tubos del [place].   Si su medicamento es muy caro, por favor, pngase en contacto con Zigmund Daniel llamando al [phone] y presione la opcin 4 o envenos un mensaje a travs de Pharmacist, community.   No  podemos decirle cul ser su copago por los medicamentos por adelantado ya que esto es diferente dependiendo de la cobertura de su seguro. Sin embargo, es posible que podamos encontrar un medicamento sustituto a Electrical engineer un formulario para que el seguro cubra el medicamento que se considera necesario.   Si se requiere una autorizacin previa para que su compaa de seguros [place] su medicamento, por favor permtanos de 1 a 2 das hbiles para completar este proceso.  Los precios de los medicamentos varan con frecuencia dependiendo del Environmental consultant de dnde se surte la receta y alguna farmacias pueden ofrecer precios ms baratos.  El sitio web www.goodrx.com tiene cupones para medicamentos de Airline pilot. Los precios aqu no tienen en cuenta lo que podra costar con la ayuda del seguro (puede ser ms barato con su seguro), pero el sitio web puede darle el precio si no Copy  ningn seguro.  - Puede imprimir el cupn correspondiente y llevarlo con su receta a la farmacia.  - Tambin puede pasar por nuestra oficina durante el horario de atencin regular y Charity fundraiser una tarjeta de cupones de GoodRx.  - Si necesita que su receta se enve electrnicamente a una farmacia diferente, informe a nuestra oficina a travs de MyChart de [place] o por telfono llamando al [phone] y presione la opcin 4.

## 2021-02-02 NOTE — Progress Notes (Signed)
Follow-Up Visit   Subjective  Vicki Walters is a 46 y.o. female who presents for the following: Annual Exam (Mole check, hx of BCC left chest removed 12/05/2020). The patient presents for Upper Body Skin Exam (UBSE) for skin cancer screening and mole check.  The patient has spots, moles and lesions to be evaluated, some may be new or changing and the patient has concerns that these could be cancer.   The following portions of the chart were reviewed this encounter and updated as appropriate:   Tobacco   Allergies   Meds   Problems   Med Hx   Surg Hx   Fam Hx      Review of Systems:  No other skin or systemic complaints except as noted in HPI or Assessment and Plan.  Objective  Well appearing patient in no apparent distress; mood and affect are within normal limits.  All skin waist up examined.  hands Dry skin, fissuring areas            Mid Back 1.5 x 1.0 cm brown macule at right mid to upper back medial scapula 0.6 cm brown macule right upper back sup to medial scapula          Assessment & Plan  Hand Eczema - severe with fissures hands  Atopic dermatitis (eczema) is a chronic, relapsing, pruritic condition that can significantly affect quality of life. It is often associated with allergic rhinitis and/or asthma and can require treatment with topical medications, phototherapy, or in severe cases biologic injectable medication (Dupixent; Adbry) or Oral JAK inhibitors.   Start Eucrisa ointment apply to hands once or twice  a day  Start otc Cerave cream apply to hands daily   Related Medications Crisaborole (EUCRISA) 2 % OINT Apply to hands qd-bid prn  Nevus Mid Back  Melanocytic Nevi - Tan-brown and/or pink-flesh-colored symmetric macules and papules - Benign appearing on exam today - Observation - Call clinic for new or changing moles - Recommend daily use of broad spectrum spf 30+ sunscreen to sun-exposed areas.    Lentigines - Scattered tan  macules - Due to sun exposure - Benign-appearing, observe - Recommend daily broad spectrum sunscreen SPF 30+ to sun-exposed areas, reapply every 2 hours as needed. - Call for any changes  Seborrheic Keratoses - Stuck-on, waxy, tan-brown papules and/or plaques  - Benign-appearing - Discussed benign etiology and prognosis. - Observe - Call for any changes  Melanocytic Nevi - Tan-brown and/or pink-flesh-colored symmetric macules and papules - Benign appearing on exam today - Observation - Call clinic for new or changing moles - Recommend daily use of broad spectrum spf 30+ sunscreen to sun-exposed areas.   Hemangiomas - Red papules - Discussed benign nature - Observe - Call for any changes  Actinic Damage - Chronic condition, secondary to cumulative UV/sun exposure - diffuse scaly erythematous macules with underlying dyspigmentation - Recommend daily broad spectrum sunscreen SPF 30+ to sun-exposed areas, reapply every 2 hours as needed.  - Staying in the shade or wearing long sleeves, sun glasses (UVA+UVB protection) and wide brim hats (4-inch brim around the entire circumference of the hat) are also recommended for sun protection.  - Call for new or changing lesions.  Skin cancer screening performed today.   Return in about 6 months (around 08/02/2021) for BCC, eczema .  Marta Lamas, CMA, am acting as scribe for Sarina Ser, MD .  Documentation: I have reviewed the above documentation for accuracy and completeness, and I agree with  the above.  Sarina Ser, MD

## 2021-02-06 ENCOUNTER — Encounter: Payer: Self-pay | Admitting: Dermatology

## 2021-08-03 ENCOUNTER — Ambulatory Visit (INDEPENDENT_AMBULATORY_CARE_PROVIDER_SITE_OTHER): Payer: BC Managed Care – PPO | Admitting: Dermatology

## 2021-08-03 DIAGNOSIS — L578 Other skin changes due to chronic exposure to nonionizing radiation: Secondary | ICD-10-CM

## 2021-08-03 DIAGNOSIS — L309 Dermatitis, unspecified: Secondary | ICD-10-CM

## 2021-08-03 DIAGNOSIS — Z85828 Personal history of other malignant neoplasm of skin: Secondary | ICD-10-CM | POA: Diagnosis not present

## 2021-08-03 DIAGNOSIS — L609 Nail disorder, unspecified: Secondary | ICD-10-CM | POA: Diagnosis not present

## 2021-08-03 MED ORDER — OPZELURA 1.5 % EX CREA: 1.0000 | TOPICAL_CREAM | Freq: Every evening | CUTANEOUS | 6 refills | Status: DC

## 2021-08-03 MED ORDER — MOMETASONE FUROATE 0.1 % EX CREA: 1.0000 | TOPICAL_CREAM | Freq: Every day | CUTANEOUS | 6 refills | Status: DC | PRN

## 2021-08-03 NOTE — Progress Notes (Signed)
   Follow-Up Visit   Subjective  Vicki Walters is a 46 y.o. female who presents for the following: Hx of BCC (L chest, EDC 12/05/2020) and Hand eczema (Hands, 56mf/u, Not using Eucrisa oint, uses otc eczema cream). Patient accompanied by her husband.  The following portions of the chart were reviewed this encounter and updated as appropriate:   Tobacco  Allergies  Meds  Problems  Med Hx  Surg Hx  Fam Hx     Review of Systems:  No other skin or systemic complaints except as noted in HPI or Assessment and Plan.  Objective  Well appearing patient in no apparent distress; mood and affect are within normal limits.  A focused examination was performed including hands, chest. Relevant physical exam findings are noted in the Assessment and Plan.  bil hands Fissures and erythema bil hands             L chest Well healed scar with no evidence of recurrence.   R and L great toenails Mild nail discoloration     Assessment & Plan  Hand dermatitis bil hands Atopic dermatitis (eczema) is a chronic, relapsing, pruritic condition that can significantly affect quality of life. It is often associated with allergic rhinitis and/or asthma and can require treatment with topical medications, phototherapy, or in severe cases biologic injectable medication (Dupixent; Adbry) or Oral JAK inhibitors.   Start Mometasone cream qam up to 5d/wk to aa hands prn flares Start Opzelura cream qhs to aa hands sample x 1 lot [id number] 08/22/2022  Chronic and persistent condition with duration or expected duration over one year. Condition is symptomatic / bothersome to patient. Not to goal.  Topical steroids (such as triamcinolone, fluocinolone, fluocinonide, mometasone, clobetasol, halobetasol, betamethasone, hydrocortisone) can cause thinning and lightening of the skin if they are used for too long in the same area. Your physician has selected the right strength medicine for your problem and area  affected on the body. Please use your medication only as directed by your physician to prevent side effects.    mometasone (ELOCON) 0.1 % cream - bil hands Apply 1 Application topically daily as needed (Rash). Apply to hands in the morning up to 5 days a week prn flares Ruxolitinib Phosphate (OPZELURA) 1.5 % CREA - bil hands Apply 1 Application topically at bedtime. Qhs to hands for eczema  History of basal cell carcinoma (BCC) L chest Clear. Observe for recurrence. Call clinic for new or changing lesions.  Recommend regular skin exams, daily broad-spectrum spf 30+ sunscreen use, and photoprotection.    Nail problem R and L great toenails 2ndary to trauma from running No specific treatment.  May improve on their own. Benign, observe  Actinic Damage - chronic, secondary to cumulative UV radiation exposure/sun exposure over time - diffuse scaly erythematous macules with underlying dyspigmentation - Recommend daily broad spectrum sunscreen SPF 30+ to sun-exposed areas, reapply every 2 hours as needed.  - Recommend staying in the shade or wearing long sleeves, sun glasses (UVA+UVB protection) and wide brim hats (4-inch brim around the entire circumference of the hat). - Call for new or changing lesions.  Return in about 6 months (around 02/03/2022) for TBSE, Hx of BCC.  I, SOthelia Pulling RMA, am acting as scribe for DSarina Ser MD . Documentation: I have reviewed the above documentation for accuracy and completeness, and I agree with the above.  DSarina Ser MD

## 2021-08-03 NOTE — Patient Instructions (Addendum)
Start Mometasone cream in the morning up to 5 days a week to affected areas of  hands as needed for flares Start Opzelura cream nightly to hands for eczema as needed for flares     Due to recent changes in healthcare laws, you may see results of your pathology and/or laboratory studies on MyChart before the doctors have had a chance to review them. We understand that in some cases there may be results that are confusing or concerning to you. Please understand that not all results are received at the same time and often the doctors may need to interpret multiple results in order to provide you with the best plan of care or course of treatment. Therefore, we ask that you please give [place] 2 business days to thoroughly review all your results before contacting the office for clarification. Should we see a critical lab result, you will be contacted sooner.   If You Need Anything After Your Visit  If you have any questions or concerns for your doctor, please call our main line at [phone] and press option 4 to reach your doctor's medical assistant. If no one answers, please leave a voicemail as directed and we will return your call as soon as possible. Messages left after 4 pm will be answered the following business day.   You may also send [place] a message via [place]. We typically respond to MyChart messages within 1-2 business days.  For prescription refills, please ask your pharmacy to contact our office. Our fax number is [phone].  If you have an urgent issue when the clinic is closed that cannot wait until the next business day, you can page your doctor at the number below.    Please note that while we do our best to be available for urgent issues outside of office hours, we are not available 24/7.   If you have an urgent issue and are unable to reach [place], you may choose to seek medical care at your doctor's office, retail clinic, urgent care center, or emergency room.  If you have a medical  emergency, please immediately call 911 or go to the emergency department.  Pager Numbers  - Dr. Nehemiah Massed: [phone]  - Dr. Laurence Ferrari: [phone]  - Dr. Nicole Kindred: [phone]  In the event of inclement weather, please call our main line at [phone] for an update on the status of any delays or closures.  Dermatology Medication Tips: Please keep the boxes that topical medications come in in order to help keep track of the instructions about where and how to use these. Pharmacies typically print the medication instructions only on the boxes and not directly on the medication tubes.   If your medication is too expensive, please contact our office at [phone] option 4 or send [place] a message through [place].   We are unable to tell what your co-pay for medications will be in advance as this is different depending on your insurance coverage. However, we may be able to find a substitute medication at lower cost or fill out paperwork to get insurance to cover a needed medication.   If a prior authorization is required to get your medication covered by your insurance company, please allow [place] 1-2 business days to complete this process.  Drug prices often vary depending on where the prescription is filled and some pharmacies may offer cheaper prices.  The website www.goodrx.com contains coupons for medications through different pharmacies. The prices here do not account for what the cost may be  with help from insurance (it may be cheaper with your insurance), but the website can give you the price if you did not use any insurance.  - You can print the associated coupon and take it with your prescription to the pharmacy.  - You may also stop by our office during regular business hours and pick up a GoodRx coupon card.  - If you need your prescription sent electronically to a different pharmacy, notify our office through [facility] or by phone at [phone] option 4.     Si Usted  Necesita Algo Despus de Su Visita  Tambin puede enviarnos un mensaje a travs de Pharmacist, community. Por lo general respondemos a los mensajes de MyChart en el transcurso de 1 a 2 das hbiles.  Para renovar recetas, por favor pida a su farmacia que se ponga en contacto con nuestra oficina. Harland Dingwall de fax es [place] [phone].  Si tiene un asunto urgente cuando la clnica est cerrada y que no puede esperar hasta el siguiente da hbil, puede llamar/localizar a su doctor(a) al nmero que aparece a continuacin.   Por favor, tenga en cuenta que aunque hacemos todo lo posible para estar disponibles para asuntos urgentes fuera del horario de [place], no estamos disponibles las 24 horas del da, los 7 das de la [place].   Si tiene un problema urgente y no puede comunicarse con nosotros, puede optar por buscar atencin mdica  en el consultorio de su doctor(a), en una clnica privada, en un centro de atencin urgente o en una sala de emergencias.  Si tiene Engineering geologist, por favor llame inmediatamente al 911 o vaya a la sala de emergencias.  Nmeros de bper  - Dr. Nehemiah Massed: [phone]  - Dra. Moye: [phone]  - Dra. Nicole Kindred: [phone]  En caso de inclemencias del [place], por favor llame a Johnsie Kindred principal al [phone] para una actualizacin sobre el [place] de cualquier retraso o cierre.  Consejos para la medicacin en dermatologa: Por favor, guarde las cajas en las que vienen los medicamentos de uso tpico para ayudarle a seguir las instrucciones sobre dnde y cmo usarlos. Las farmacias generalmente imprimen las instrucciones del medicamento slo en las cajas y no directamente en los tubos del [place].   Si su medicamento es muy caro, por favor, pngase en contacto con Zigmund Daniel llamando al [phone] y presione la opcin 4 o envenos un mensaje a travs de Pharmacist, community.   No podemos decirle cul ser su copago por los medicamentos por adelantado ya que esto es  diferente dependiendo de la cobertura de su seguro. Sin embargo, es posible que podamos encontrar un medicamento sustituto a Electrical engineer un formulario para que el seguro cubra el medicamento que se considera necesario.   Si se requiere una autorizacin previa para que su compaa de seguros [place] su medicamento, por favor permtanos de 1 a 2 das hbiles para completar este proceso.  Los precios de los medicamentos varan con frecuencia dependiendo del Environmental consultant de dnde se surte la receta y alguna farmacias pueden ofrecer precios ms baratos.  El sitio web www.goodrx.com tiene cupones para medicamentos de Airline pilot. Los precios aqu no tienen en cuenta lo que podra costar con la ayuda del seguro (puede ser ms barato con su seguro), pero el sitio web puede darle el precio si no utiliz Research scientist (physical sciences).  - Puede imprimir el cupn correspondiente y llevarlo con su receta a la farmacia.  - Tambin puede pasar por Cleotis Nipper oficina durante  el horario de atencin regular y Charity fundraiser una tarjeta de cupones de GoodRx.  - Si necesita que su receta se enve electrnicamente a una farmacia diferente, informe a nuestra oficina a travs de MyChart de [place] o por telfono llamando al [phone] y presione la opcin 4.

## 2021-08-15 ENCOUNTER — Encounter: Payer: Self-pay | Admitting: Dermatology

## 2022-02-01 ENCOUNTER — Encounter: Payer: Self-pay | Admitting: Internal Medicine

## 2022-02-08 ENCOUNTER — Ambulatory Visit: Payer: BC Managed Care – PPO | Admitting: Dermatology

## 2022-02-15 ENCOUNTER — Ambulatory Visit (INDEPENDENT_AMBULATORY_CARE_PROVIDER_SITE_OTHER): Payer: BC Managed Care – PPO | Admitting: Dermatology

## 2022-02-15 ENCOUNTER — Encounter: Payer: Self-pay | Admitting: Dermatology

## 2022-02-15 VITALS — BP 106/63

## 2022-02-15 DIAGNOSIS — D239 Other benign neoplasm of skin, unspecified: Secondary | ICD-10-CM

## 2022-02-15 DIAGNOSIS — L821 Other seborrheic keratosis: Secondary | ICD-10-CM

## 2022-02-15 DIAGNOSIS — L814 Other melanin hyperpigmentation: Secondary | ICD-10-CM

## 2022-02-15 DIAGNOSIS — D225 Melanocytic nevi of trunk: Secondary | ICD-10-CM | POA: Diagnosis not present

## 2022-02-15 DIAGNOSIS — D229 Melanocytic nevi, unspecified: Secondary | ICD-10-CM

## 2022-02-15 DIAGNOSIS — Z1283 Encounter for screening for malignant neoplasm of skin: Secondary | ICD-10-CM | POA: Diagnosis not present

## 2022-02-15 DIAGNOSIS — Z85828 Personal history of other malignant neoplasm of skin: Secondary | ICD-10-CM

## 2022-02-15 DIAGNOSIS — L578 Other skin changes due to chronic exposure to nonionizing radiation: Secondary | ICD-10-CM

## 2022-02-15 DIAGNOSIS — D485 Neoplasm of uncertain behavior of skin: Secondary | ICD-10-CM

## 2022-02-15 HISTORY — DX: Other benign neoplasm of skin, unspecified: D23.9

## 2022-02-15 NOTE — Progress Notes (Signed)
Follow-Up Visit   Subjective  Vicki Walters is a 47 y.o. female who presents for the following: Annual Exam (History of BCC - The patient presents for Total-Body Skin Exam (TBSE) for skin cancer screening and mole check.  The patient has spots, moles and lesions to be evaluated, some may be new or changing and the patient has concerns that these could be cancer.//).  The following portions of the chart were reviewed this encounter and updated as appropriate:   Tobacco  Allergies  Meds  Problems  Med Hx  Surg Hx  Fam Hx     Review of Systems:  No other skin or systemic complaints except as noted in HPI or Assessment and Plan.  Objective  Well appearing patient in no apparent distress; mood and affect are within normal limits.  A full examination was performed including scalp, head, eyes, ears, nose, lips, neck, chest, axillae, abdomen, back, buttocks, bilateral upper extremities, bilateral lower extremities, hands, feet, fingers, toes, fingernails, and toenails. All findings within normal limits unless otherwise noted below.  Right inf lat buttock Irregular brown macule 0.8 cm  Right upper back inf to medial scapula Irregular brown macule 1.5 cm  Right sup medial scapula Irregular brown macule 0.7 cm  Left UQA Irregular brown macule 0.6 cm   Assessment & Plan  Neoplasm of uncertain behavior of skin (4) Right inf lat buttock  Epidermal / dermal shaving  Lesion diameter (cm):  0.8 Informed consent: discussed and consent obtained   Timeout: patient name, date of birth, surgical site, and procedure verified   Procedure prep:  Patient was prepped and draped in usual sterile fashion Prep type:  Isopropyl alcohol Anesthesia: the lesion was anesthetized in a standard fashion   Anesthetic:  1% lidocaine w/ epinephrine 1-100,000 buffered w/ 8.4% NaHCO3 Instrument used: flexible razor blade   Hemostasis achieved with: pressure, aluminum chloride and electrodesiccation    Outcome: patient tolerated procedure well   Post-procedure details: sterile dressing applied and wound care instructions given   Dressing type: bandage and petrolatum    Specimen 1 - Surgical pathology Differential Diagnosis: Nevus vs dysplastic nevus Check Margins: No  Right upper back inf to medial scapula  Epidermal / dermal shaving  Lesion diameter (cm):  1.5 Informed consent: discussed and consent obtained   Timeout: patient name, date of birth, surgical site, and procedure verified   Procedure prep:  Patient was prepped and draped in usual sterile fashion Prep type:  Isopropyl alcohol Anesthesia: the lesion was anesthetized in a standard fashion   Anesthetic:  1% lidocaine w/ epinephrine 1-100,000 buffered w/ 8.4% NaHCO3 Instrument used: flexible razor blade   Hemostasis achieved with: pressure, aluminum chloride and electrodesiccation   Outcome: patient tolerated procedure well   Post-procedure details: sterile dressing applied and wound care instructions given   Dressing type: bandage and petrolatum    Specimen 2 - Surgical pathology Differential Diagnosis: Nevus vs dysplastic nevus Check Margins: No  Right sup medial scapula  Epidermal / dermal shaving  Lesion diameter (cm):  0.7 Informed consent: discussed and consent obtained   Timeout: patient name, date of birth, surgical site, and procedure verified   Procedure prep:  Patient was prepped and draped in usual sterile fashion Prep type:  Isopropyl alcohol Anesthesia: the lesion was anesthetized in a standard fashion   Anesthetic:  1% lidocaine w/ epinephrine 1-100,000 buffered w/ 8.4% NaHCO3 Instrument used: flexible razor blade   Hemostasis achieved with: pressure, aluminum chloride and electrodesiccation   Outcome:  patient tolerated procedure well   Post-procedure details: sterile dressing applied and wound care instructions given   Dressing type: bandage and petrolatum    Specimen 3 - Surgical  pathology Differential Diagnosis: Nevus vs dysplastic nevus  Check Margins: No  Left UQA  Epidermal / dermal shaving  Lesion diameter (cm):  0.6 Informed consent: discussed and consent obtained   Timeout: patient name, date of birth, surgical site, and procedure verified   Procedure prep:  Patient was prepped and draped in usual sterile fashion Prep type:  Isopropyl alcohol Anesthesia: the lesion was anesthetized in a standard fashion   Anesthetic:  1% lidocaine w/ epinephrine 1-100,000 buffered w/ 8.4% NaHCO3 Instrument used: flexible razor blade   Hemostasis achieved with: pressure, aluminum chloride and electrodesiccation   Outcome: patient tolerated procedure well   Post-procedure details: sterile dressing applied and wound care instructions given   Dressing type: bandage and petrolatum    Specimen 4 - Surgical pathology Differential Diagnosis: Nevus vs dysplastic nevus Check Margins: No  Skin cancer screening  Actinic skin damage  Lentigo  Melanocytic nevus, unspecified location  History of basal cell carcinoma  Seborrheic keratosis  Lentigines - Scattered tan macules - Due to sun exposure - Benign-appearing, observe - Recommend daily broad spectrum sunscreen SPF 30+ to sun-exposed areas, reapply every 2 hours as needed. - Call for any changes  Seborrheic Keratoses - Stuck-on, waxy, tan-brown papules and/or plaques  - Benign-appearing - Discussed benign etiology and prognosis. - Observe - Call for any changes  Melanocytic Nevi - Tan-brown and/or pink-flesh-colored symmetric macules and papules - Benign appearing on exam today - Observation - Call clinic for new or changing moles - Recommend daily use of broad spectrum spf 30+ sunscreen to sun-exposed areas.   Hemangiomas - Red papules - Discussed benign nature - Observe - Call for any changes  Actinic Damage - Chronic condition, secondary to cumulative UV/sun exposure - diffuse scaly erythematous  macules with underlying dyspigmentation - Recommend daily broad spectrum sunscreen SPF 30+ to sun-exposed areas, reapply every 2 hours as needed.  - Staying in the shade or wearing long sleeves, sun glasses (UVA+UVB protection) and wide brim hats (4-inch brim around the entire circumference of the hat) are also recommended for sun protection.  - Call for new or changing lesions.  Skin cancer screening performed today.  Follow up 6 mos.  Documentation: I have reviewed the above documentation for accuracy and completeness, and I agree with the above.  Sarina Ser, MD

## 2022-02-16 ENCOUNTER — Other Ambulatory Visit: Payer: Self-pay | Admitting: Internal Medicine

## 2022-02-16 DIAGNOSIS — Z1231 Encounter for screening mammogram for malignant neoplasm of breast: Secondary | ICD-10-CM

## 2022-02-22 ENCOUNTER — Telehealth: Payer: Self-pay

## 2022-02-22 NOTE — Telephone Encounter (Signed)
-----  Message from Ralene Bathe, MD sent at 02/21/2022  8:29 PM EST ----- Diagnosis 1. Skin , right inf lat buttock DYSPLASTIC COMPOUND NEVUS WITH MODERATE ATYPIA, CLOSE TO MARGIN 2. Skin , right upper back inf to medial scapula DYSPLASTIC COMPOUND NEVUS WITH MILD ATYPIA, CLOSE TO MARGIN 3. Skin , right sup medial scapula DYSPLASTIC COMPOUND NEVUS WITH MODERATE ATYPIA 4. Skin , left UQA DYSPLASTIC COMPOUND NEVUS WITH MODERATE ATYPIA, PERIPHERAL MARGIN INVOLVED  1,3,4 - all three Moderate dysplastic Recheck next visit 2- Mild dysplastic Recheck next visit

## 2022-02-22 NOTE — Telephone Encounter (Signed)
Patient's spouse advised of BX results. aw

## 2022-02-22 NOTE — Telephone Encounter (Signed)
Left pt msg to call for bx results/sh 

## 2022-02-23 ENCOUNTER — Encounter: Payer: Self-pay | Admitting: Dermatology

## 2022-03-08 ENCOUNTER — Ambulatory Visit
Admission: RE | Admit: 2022-03-08 | Discharge: 2022-03-08 | Disposition: A | Discharge status: Home / Self Care | Payer: BC Managed Care – PPO | Source: Ambulatory Visit | Attending: Internal Medicine | Admitting: Internal Medicine

## 2022-03-08 DIAGNOSIS — Z1231 Encounter for screening mammogram for malignant neoplasm of breast: Secondary | ICD-10-CM | POA: Diagnosis not present

## 2022-08-09 ENCOUNTER — Ambulatory Visit: Payer: BC Managed Care – PPO | Admitting: Dermatology

## 2022-09-10 ENCOUNTER — Ambulatory Visit: Payer: BC Managed Care – PPO | Admitting: Dermatology

## 2022-12-14 ENCOUNTER — Telehealth: Payer: Self-pay

## 2022-12-14 NOTE — Telephone Encounter (Signed)
She has not been seen in 2 years.  She needs to schedule an appointment for a physical and we can order the colonoscopy at that time

## 2022-12-14 NOTE — Telephone Encounter (Signed)
Had a message patient requesting GI referral, colonoscopy.

## 2023-01-03 ENCOUNTER — Encounter: Payer: Self-pay | Admitting: Internal Medicine

## 2023-01-03 ENCOUNTER — Ambulatory Visit (INDEPENDENT_AMBULATORY_CARE_PROVIDER_SITE_OTHER): Payer: BC Managed Care – PPO | Admitting: Internal Medicine

## 2023-01-03 VITALS — BP 110/68 | Ht 65.0 in | Wt 137.6 lb

## 2023-01-03 DIAGNOSIS — Z1231 Encounter for screening mammogram for malignant neoplasm of breast: Secondary | ICD-10-CM

## 2023-01-03 DIAGNOSIS — C44509 Unspecified malignant neoplasm of skin of other part of trunk: Secondary | ICD-10-CM | POA: Insufficient documentation

## 2023-01-03 DIAGNOSIS — Z1159 Encounter for screening for other viral diseases: Secondary | ICD-10-CM

## 2023-01-03 DIAGNOSIS — Z136 Encounter for screening for cardiovascular disorders: Secondary | ICD-10-CM

## 2023-01-03 DIAGNOSIS — Z0001 Encounter for general adult medical examination with abnormal findings: Secondary | ICD-10-CM | POA: Diagnosis not present

## 2023-01-03 DIAGNOSIS — Z1211 Encounter for screening for malignant neoplasm of colon: Secondary | ICD-10-CM | POA: Diagnosis not present

## 2023-01-03 DIAGNOSIS — R739 Hyperglycemia, unspecified: Secondary | ICD-10-CM

## 2023-01-03 NOTE — Progress Notes (Signed)
Subjective:    Patient ID: Vicki Walters, female    DOB: [birth year], 47 y.o.   MRN: [id number]  HPI  Patient presents to clinic today for her annual exam.  Flu: Tetanus: 01/2016 COVID: X 2 Pap smear: 09/2018 Mammogram: 02/2022 Colon screening: Never Vision screening: annually Dentist: as needed  Diet: She does eat meat. She consumes fruits and veggies. She does eat some fried foods.  Exercise: Run 4 x week  Review of Systems   Past Medical History:  Diagnosis Date   Basal cell carcinoma 10/20/2020   BCC recurrent, EDC 12/05/2020   Dysplastic nevus 02/15/2022   Right inf lat buttock, moderate atypia   Dysplastic nevus 02/15/2022   Right upper back inf to medial scapula, mild atypia   Dysplastic nevus 02/15/2022   Right sup medial scapula, moderate atypia   Dysplastic nevus 02/15/2022   Left UQA, moderate atypia   GERD (gastroesophageal reflux disease)    Thyroid disease     Current Outpatient Medications  Medication Sig Dispense Refill   Crisaborole (EUCRISA) 2 % OINT Apply to hands qd-bid prn 60 g 2   mometasone (ELOCON) 0.1 % cream Apply 1 Application topically daily as needed (Rash). Apply to hands in the morning up to 5 days a week prn flares 45 g 6   Ruxolitinib Phosphate (OPZELURA) 1.5 % CREA Apply 1 Application topically at bedtime. Qhs to hands for eczema 60 g 6   No current facility-administered medications for this visit.    No Known Allergies  Family History  Problem Relation Age of Onset   Heart disease Maternal Grandmother    Heart attack Maternal Grandmother    Cancer Maternal Grandfather    Diabetes Paternal Grandfather    Breast cancer Sister 35    Social History   Socioeconomic History   Marital status: Married    Spouse name: Not on file   Number of children: Not on file   Years of education: Not on file   Highest education level: Some college, no degree  Occupational History   Not on file  Tobacco Use   Smoking status: Former     Current packs/day: 0.00    Average packs/day: 0.5 packs/day for 10.0 years (5.0 ttl pk-yrs)    Types: Cigarettes    Start date: 03/09/1997    Quit date: 03/10/2007    Years since quitting: 15.8   Smokeless tobacco: Never  Vaping Use   Vaping status: Never Used  Substance and Sexual Activity   Alcohol use: Yes    Comment: occasional    Drug use: No   Sexual activity: Yes  Other Topics Concern   Not on file  Social History Narrative   Not on file   Social Drivers of Health   Financial Resource Strain: Patient Declined (01/02/2023)   Overall Financial Resource Strain (CARDIA)    Difficulty of Paying Living Expenses: Patient declined  Food Insecurity: Patient Declined (01/02/2023)   Hunger Vital Sign    Worried About Running Out of Food in the Last Year: Patient declined    Ran Out of Food in the Last Year: Patient declined  Transportation Needs: No Transportation Needs (01/02/2023)   PRAPARE - Administrator, Civil Service (Medical): No    Lack of Transportation (Non-Medical): No  Physical Activity: Sufficiently Active (01/02/2023)   Exercise Vital Sign    Days of Exercise per Week: 4 days    Minutes of Exercise per Session: 150+ min  Stress: No Stress  Concern Present (01/02/2023)   [employer] of Occupational Health - Occupational Stress Questionnaire    Feeling of Stress : Only a little  Social Connections: Moderately Integrated (01/02/2023)   Social Connection and Isolation Panel [NHANES]    Frequency of Communication with Friends and Family: More than three times a week    Frequency of Social Gatherings with Friends and Family: Once a week    Attends Religious Services: 1 to 4 times per year    Active Member of [employer] or Organizations: No    Attends Engineer, structural: Not on file    Marital Status: Married  Catering manager Violence: Not on file     Constitutional: Denies fever, malaise, fatigue, headache or abrupt weight changes.   HEENT: Pt reports hearing problem. Denies eye pain, eye redness, ear pain, ringing in the ears, wax buildup, runny nose, nasal congestion, bloody nose, or sore throat. Respiratory: Denies difficulty breathing, shortness of breath, cough or sputum production.   Cardiovascular: Denies chest pain, chest tightness, palpitations or swelling in the hands or feet.  Gastrointestinal: Denies abdominal pain, bloating, constipation, diarrhea or blood in the stool.  GU: Pt reports irregular periods. Denies urgency, frequency, pain with urination, burning sensation, blood in urine, odor or discharge. Musculoskeletal: Denies decrease in range of motion, difficulty with gait, muscle pain or joint pain and swelling.  Skin: Denies redness, rashes, lesions or ulcercations.  Neurological: Pt reports hot flashes. Denies dizziness, difficulty with memory, difficulty with speech or problems with balance and coordination.  Psych: Denies anxiety, depression, SI/HI.  No other specific complaints in a complete review of systems (except as listed in HPI above).      Objective:   Physical Exam BP 110/68 (BP Location: Left Arm, Patient Position: Sitting, Cuff Size: Normal)   Ht 5\' 5"  (1.651 m)   Wt 137 lb 9.6 oz (62.4 kg)   BMI 22.90 kg/m   Wt Readings from Last 3 Encounters:  10/26/20 143 lb (64.9 kg)  04/21/20 157 lb 9.6 oz (71.5 kg)  09/04/19 157 lb (71.2 kg)    General: Appears her stated age, well developed, well nourished in NAD. Skin: Warm, dry and intact.  HEENT: Head: normal shape and size; Eyes: sclera white, no icterus, conjunctiva pink, PERRLA and EOMs intact;  Ears: wearing hearing aides Neck:  Neck supple, trachea midline. No masses, lumps or thyromegaly present.  Cardiovascular: Normal rate and rhythm. S1,S2 noted.  No murmur, rubs or gallops noted. No JVD or BLE edema.  Pulmonary/Chest: Normal effort and positive vesicular breath sounds. No respiratory distress. No wheezes, rales or ronchi  noted.  Abdomen: Soft and nontender. Normal bowel sounds.  Musculoskeletal: Strength 5/5 BUE/BLE.  No difficulty with gait.  Neurological: Alert and oriented. Cranial nerves II-XII grossly intact. Coordination normal.  Psychiatric: Mood and affect normal. Behavior is normal. Judgment and thought content normal.    BMET    Component Value Date/Time   NA 138 04/21/2020 0932   NA 141 11/19/2018 0946   NA 135 (L) 06/18/2013 2001   K 4.2 04/21/2020 0932   K 3.3 (L) 06/18/2013 2001   CL 104 04/21/2020 0932   CL 99 06/18/2013 2001   CO2 27 04/21/2020 0932   CO2 27 06/18/2013 2001   GLUCOSE 87 04/21/2020 0932   GLUCOSE 91 06/18/2013 2001   BUN 12 04/21/2020 0932   BUN 17 11/19/2018 0946   BUN 13 06/18/2013 2001   CREATININE 0.85 04/21/2020 0932   CALCIUM 9.6 04/21/2020  0932   CALCIUM 9.6 06/18/2013 2001   GFRNONAA 92 11/19/2018 0946   GFRNONAA 90 05/29/2018 1048   GFRAA 106 11/19/2018 0946   GFRAA 105 05/29/2018 1048    Lipid Panel     Component Value Date/Time   CHOL 204 (H) 10/26/2020 0917   CHOL 240 (H) 03/11/2015 1347   TRIG 75 10/26/2020 0917   HDL 63 10/26/2020 0917   HDL 70 03/11/2015 1347   CHOLHDL 3.2 10/26/2020 0917   LDLCALC 124 (H) 10/26/2020 0917    CBC    Component Value Date/Time   WBC 3.2 (L) 04/21/2020 0932   RBC 4.76 04/21/2020 0932   HGB 15.4 04/21/2020 0932   HGB 14.6 06/01/2015 0834   HCT 46.3 (H) 04/21/2020 0932   HCT 42.7 06/01/2015 0834   PLT 238 04/21/2020 0932   PLT 232 06/01/2015 0834   MCV 97.3 04/21/2020 0932   MCV 96 06/01/2015 0834   MCV 97 06/18/2013 2001   MCH 32.4 04/21/2020 0932   MCHC 33.3 04/21/2020 0932   RDW 12.2 04/21/2020 0932   RDW 13.7 06/01/2015 0834   RDW 12.6 06/18/2013 2001   LYMPHSABS 1,261 04/21/2020 0932   LYMPHSABS 1.6 06/01/2015 0834   MONOABS 0.7 04/25/2016 0830   EOSABS 29 04/21/2020 0932   EOSABS 0.0 06/01/2015 0834   BASOSABS 29 04/21/2020 0932   BASOSABS 0.0 06/01/2015 0834    Hgb A1C Lab  Results  Component Value Date   HGBA1C 5.3 10/01/2018            Assessment & Plan:   Preventative health maintenance:  Flu shot declined Tetanus UTD Encouraged her to get her COVID booster Pap smear UTD Mammogram ordered-she will call to schedule Referral to GI for screening colonoscopy Encouraged her to consume a balanced diet and exercise regimen Advised her to see an eye doctor and dentist annually We will check CBC, c-Met, lipid, A1c and hep C today  RTC in 1 year for your annual exam Nicki Reaper, NP

## 2023-01-03 NOTE — Patient Instructions (Signed)

## 2023-01-04 LAB — LIPID PANEL
Cholesterol: 248 mg/dL — ABNORMAL HIGH (ref <200)
HDL: 67 mg/dL (ref >=50)
LDL Cholesterol (Calc): 142 mg/dL — ABNORMAL HIGH
Non-HDL Cholesterol (Calc): 181 mg/dL — ABNORMAL HIGH (ref <130)
Total CHOL/HDL Ratio: 3.7 (calc) (ref <5.0)
Triglycerides: 244 mg/dL — ABNORMAL HIGH (ref <150)

## 2023-01-04 LAB — COMPLETE METABOLIC PANEL WITH GFR
AG Ratio: 1.7 (calc) (ref 1.0–2.5)
ALT: 18 U/L (ref 6–29)
AST: 22 U/L (ref 10–35)
Albumin: 4.6 g/dL (ref 3.6–5.1)
Alkaline phosphatase (APISO): 80 U/L (ref 31–125)
BUN: 15 mg/dL (ref 7–25)
CO2: 30 mmol/L (ref 20–32)
Calcium: 10.2 mg/dL (ref 8.6–10.2)
Chloride: 100 mmol/L (ref 98–110)
Creat: 0.93 mg/dL (ref 0.50–0.99)
Globulin: 2.7 g/dL (ref 1.9–3.7)
Glucose, Bld: 87 mg/dL (ref 65–139)
Potassium: 4.2 mmol/L (ref 3.5–5.3)
Sodium: 138 mmol/L (ref 135–146)
Total Bilirubin: 0.6 mg/dL (ref 0.2–1.2)
Total Protein: 7.3 g/dL (ref 6.1–8.1)
eGFR: 77 mL/min/{1.73_m2} (ref >=60)

## 2023-01-04 LAB — CBC
HCT: 43.8 % (ref 35.0–45.0)
Hemoglobin: 14.9 g/dL (ref 11.7–15.5)
MCH: 32.1 pg (ref 27.0–33.0)
MCHC: 34 g/dL (ref 32.0–36.0)
MCV: 94.4 fL (ref 80.0–100.0)
MPV: 9 fL (ref 7.5–12.5)
Platelets: 324 10*3/uL (ref 140–400)
RBC: 4.64 10*6/uL (ref 3.80–5.10)
RDW: 12.8 % (ref 11.0–15.0)
WBC: 7.2 10*3/uL (ref 3.8–10.8)

## 2023-01-04 LAB — HEMOGLOBIN A1C
Hgb A1c MFr Bld: 5.6 %{Hb} (ref <5.7)
Mean Plasma Glucose: 114 mg/dL
eAG (mmol/L): 6.3 mmol/L

## 2023-01-04 LAB — HEPATITIS C ANTIBODY: Hepatitis C Ab: NONREACTIVE

## 2023-01-07 ENCOUNTER — Other Ambulatory Visit: Payer: Self-pay

## 2023-01-07 ENCOUNTER — Telehealth: Payer: Self-pay

## 2023-01-07 DIAGNOSIS — Z1211 Encounter for screening for malignant neoplasm of colon: Secondary | ICD-10-CM

## 2023-01-07 MED ORDER — NA SULFATE-K SULFATE-MG SULF 17.5-3.13-1.6 GM/177ML PO SOLN
1.0000 | Freq: Once | ORAL | 0 refills | Status: AC
Start: 2023-01-07 — End: 2023-01-07

## 2023-01-07 NOTE — Telephone Encounter (Signed)
Gastroenterology Pre-Procedure Review  Request Date: 02/10/22 Requesting Physician: Dr. Servando Snare  PATIENT REVIEW QUESTIONS: The patient responded to the following health history questions as indicated:    1. Are you having any GI issues? no 2. Do you have a personal history of Polyps? no 3. Do you have a family history of Colon Cancer or Polyps? no 4. Diabetes Mellitus? no 5. Joint replacements in the past 12 months?no 6. Major health problems in the past 3 months?no 7. Any artificial heart valves, MVP, or defibrillator?no    MEDICATIONS & ALLERGIES:    Patient reports the following regarding taking any anticoagulation/antiplatelet therapy:   Plavix, Coumadin, Eliquis, Xarelto, Lovenox, Pradaxa, Brilinta, or Effient? no Aspirin? no  Patient confirms/reports the following medications:  Current Outpatient Medications  Medication Sig Dispense Refill   Multiple Vitamin (MULTIVITAMIN) capsule Take 1 capsule by mouth daily.     No current facility-administered medications for this visit.    Patient confirms/reports the following allergies:  No Known Allergies  No orders of the defined types were placed in this encounter.   AUTHORIZATION INFORMATION Primary Insurance: 1D#: Group #:  Secondary Insurance: 1D#: Group #:  SCHEDULE INFORMATION: Date: 02/11/23 Time: Location: [facility]

## 2023-01-08 ENCOUNTER — Encounter: Payer: Self-pay | Admitting: Internal Medicine

## 2023-01-08 DIAGNOSIS — E782 Mixed hyperlipidemia: Secondary | ICD-10-CM

## 2023-01-08 MED ORDER — ATORVASTATIN CALCIUM 10 MG PO TABS: 10.0000 mg | ORAL_TABLET | Freq: Every day | ORAL | 1 refills | Status: DC

## 2023-01-31 ENCOUNTER — Other Ambulatory Visit: Payer: Self-pay

## 2023-01-31 MED ORDER — NA SULFATE-K SULFATE-MG SULF 17.5-3.13-1.6 GM/177ML PO SOLN: 1.0000 | Freq: Once | ORAL | 0 refills | Status: AC

## 2023-02-04 ENCOUNTER — Encounter: Payer: Self-pay | Admitting: Gastroenterology

## 2023-02-11 ENCOUNTER — Other Ambulatory Visit: Payer: Self-pay

## 2023-02-11 ENCOUNTER — Ambulatory Visit: Payer: Self-pay | Admitting: Anesthesiology

## 2023-02-11 ENCOUNTER — Encounter: Payer: Self-pay | Admitting: Gastroenterology

## 2023-02-11 ENCOUNTER — Ambulatory Visit
Admission: RE | Admit: 2023-02-11 | Discharge: 2023-02-11 | Disposition: A | Discharge status: Home / Self Care | Payer: No Typology Code available for payment source | Attending: Gastroenterology | Admitting: Gastroenterology

## 2023-02-11 ENCOUNTER — Encounter
Admission: RE | Disposition: A | Discharge status: Home / Self Care | Payer: Self-pay | Source: Home / Self Care | Attending: Gastroenterology

## 2023-02-11 DIAGNOSIS — D123 Benign neoplasm of transverse colon: Secondary | ICD-10-CM | POA: Insufficient documentation

## 2023-02-11 DIAGNOSIS — K635 Polyp of colon: Secondary | ICD-10-CM | POA: Diagnosis not present

## 2023-02-11 DIAGNOSIS — K640 First degree hemorrhoids: Secondary | ICD-10-CM | POA: Diagnosis not present

## 2023-02-11 DIAGNOSIS — Z1211 Encounter for screening for malignant neoplasm of colon: Secondary | ICD-10-CM

## 2023-02-11 DIAGNOSIS — K219 Gastro-esophageal reflux disease without esophagitis: Secondary | ICD-10-CM | POA: Diagnosis not present

## 2023-02-11 DIAGNOSIS — Z87891 Personal history of nicotine dependence: Secondary | ICD-10-CM | POA: Insufficient documentation

## 2023-02-11 HISTORY — PX: COLONOSCOPY WITH PROPOFOL: SHX5780

## 2023-02-11 HISTORY — DX: Presence of spectacles and contact lenses: Z97.3

## 2023-02-11 HISTORY — DX: Unspecified hearing loss, unspecified ear: H91.90

## 2023-02-11 HISTORY — PX: POLYPECTOMY: SHX5525

## 2023-02-11 LAB — POCT PREGNANCY, URINE: Preg Test, Ur: NEGATIVE

## 2023-02-11 SURGERY — COLONOSCOPY WITH PROPOFOL
Anesthesia: General | Site: Rectum

## 2023-02-11 MED ORDER — SODIUM CHLORIDE 0.9% FLUSH: 3.0000 mL | Freq: Two times a day (BID) | INTRAVENOUS | Status: DC

## 2023-02-11 MED ORDER — SODIUM CHLORIDE 0.9% FLUSH
3.0000 mL | INTRAVENOUS | Status: DC | PRN
Start: 2023-02-11 — End: 2023-02-11

## 2023-02-11 MED ORDER — PROPOFOL 10 MG/ML IV BOLUS
INTRAVENOUS | Status: AC
  Filled 2023-02-11: qty 40

## 2023-02-11 MED ORDER — SODIUM CHLORIDE 0.9 % IV SOLN: INTRAVENOUS | Status: DC

## 2023-02-11 MED ORDER — STERILE WATER FOR IRRIGATION IR SOLN
Status: DC | PRN
  Administered 2023-02-11: 100 mL

## 2023-02-11 MED ORDER — LIDOCAINE HCL (PF) 2 % IJ SOLN
INTRAMUSCULAR | Status: AC
  Filled 2023-02-11: qty 5

## 2023-02-11 MED ORDER — LIDOCAINE HCL (CARDIAC) PF 100 MG/5ML IV SOSY
PREFILLED_SYRINGE | INTRAVENOUS | Status: DC | PRN
  Administered 2023-02-11: 50 mg via INTRAVENOUS

## 2023-02-11 MED ORDER — PROPOFOL 10 MG/ML IV BOLUS
INTRAVENOUS | Status: DC | PRN
  Administered 2023-02-11: 90 mg via INTRAVENOUS
  Administered 2023-02-11: 40 mg via INTRAVENOUS
  Administered 2023-02-11 (×2): 50 mg via INTRAVENOUS

## 2023-02-11 SURGICAL SUPPLY — 5 items
FORCEPS BIOP RAD 4 LRG CAP 4 (CUTTING FORCEPS) IMPLANT
GOWN CVR UNV OPN BCK APRN NK (MISCELLANEOUS) ×2 IMPLANT
KIT PRC NS LF DISP ENDO (KITS) ×1 IMPLANT
MANIFOLD NEPTUNE II (INSTRUMENTS) ×1 IMPLANT
WATER STERILE IRR 250ML POUR (IV SOLUTION) ×1 IMPLANT

## 2023-02-11 NOTE — H&P (Signed)
Midge Minium, MD [facility] [address] [place], [place] [address] Phone: [phone] Fax : [phone]  Primary Care Physician:  Lorre Munroe, NP Primary Gastroenterologist:  Dr. Servando Snare  Pre-Procedure History & Physical: HPI:  Vicki Walters is a 48 y.o. female is here for a screening colonoscopy.   Past Medical History:  Diagnosis Date   Anxiety The past year   Just sometimes   Basal cell carcinoma 10/20/2020   BCC recurrent, EDC 12/05/2020   Deaf    Dysplastic nevus 02/15/2022   Right inf lat buttock, moderate atypia   Dysplastic nevus 02/15/2022   Right upper back inf to medial scapula, mild atypia   Dysplastic nevus 02/15/2022   Right sup medial scapula, moderate atypia   Dysplastic nevus 02/15/2022   Left UQA, moderate atypia   GERD (gastroesophageal reflux disease)    Thyroid disease    Wears contact lenses     History reviewed. No pertinent surgical history.  Prior to Admission medications   Medication Sig Start Date End Date Taking? Authorizing Provider  atorvastatin (LIPITOR) 10 MG tablet Take 1 tablet (10 mg total) by mouth daily. 01/08/23  Yes Lorre Munroe, NP  Multiple Vitamin (MULTIVITAMIN) capsule Take 1 capsule by mouth daily.   Yes [provider]    Allergies as of 01/07/2023   (No Known Allergies)    Family History  Problem Relation Age of Onset   Heart disease Maternal Grandmother    Heart attack Maternal Grandmother    Cancer Maternal Grandfather    Hearing loss Maternal Grandfather    Diabetes Paternal Grandfather    Hearing loss Mother    Heart disease Father    Breast cancer Sister 65   Cancer Sister    Hearing loss Sister    Diabetes Paternal Aunt     Social History   Socioeconomic History   Marital status: Married    Spouse name: Not on file   Number of children: Not on file   Years of education: Not on file   Highest education level: Some college, no degree  Occupational History   Not on file   Tobacco Use   Smoking status: Former    Current packs/day: 0.00    Average packs/day: 0.5 packs/day for 10.0 years (5.0 ttl pk-yrs)    Types: Cigarettes    Start date: 03/09/1997    Quit date: 03/10/2007    Years since quitting: 15.9   Smokeless tobacco: Never   Tobacco comments:    I quit 15 years ago.  Vaping Use   Vaping status: Never Used  Substance and Sexual Activity   Alcohol use: Yes    Alcohol/week: 3.0 standard drinks of alcohol    Types: 3 Glasses of wine per week    Comment: I have wine on the weekends only.   Drug use: Never   Sexual activity: Yes    Birth control/protection: None  Other Topics Concern   Not on file  Social History Narrative   Not on file   Social Drivers of Health   Financial Resource Strain: Patient Declined (01/02/2023)   Overall Financial Resource Strain (CARDIA)    Difficulty of Paying Living Expenses: Patient declined  Food Insecurity: Patient Declined (01/02/2023)   Hunger Vital Sign    Worried About Running Out of Food in the Last Year: Patient declined    Ran Out of Food in the Last Year: Patient declined  Transportation Needs: No Transportation Needs (01/02/2023)   PRAPARE -  Administrator, Civil Service (Medical): No    Lack of Transportation (Non-Medical): No  Physical Activity: Sufficiently Active (01/02/2023)   Exercise Vital Sign    Days of Exercise per Week: 4 days    Minutes of Exercise per Session: 150+ min  Stress: No Stress Concern Present (01/02/2023)   [employer] of Occupational Health - Occupational Stress Questionnaire    Feeling of Stress : Only a little  Social Connections: Moderately Integrated (01/02/2023)   Social Connection and Isolation Panel [NHANES]    Frequency of Communication with Friends and Family: More than three times a week    Frequency of Social Gatherings with Friends and Family: Once a week    Attends Religious Services: 1 to 4 times per year    Active Member of [employer] or  Organizations: No    Attends Engineer, structural: Not on file    Marital Status: Married  Intimate Partner Violence: Patient Declined (01/03/2023)   Humiliation, Afraid, Rape, and Kick questionnaire    Fear of Current or Ex-Partner: Patient declined    Emotionally Abused: Patient declined    Physically Abused: Patient declined    Sexually Abused: Patient declined    Review of Systems: See HPI, otherwise negative ROS  Physical Exam: BP 105/69   Pulse 77   Temp (!) 97.5 F (36.4 C) (Temporal)   Resp 18   Ht 5\' 5"  (1.651 m)   Wt 62.1 kg   LMP  (LMP Unknown)   SpO2 98%   Breastfeeding No   BMI 22.80 kg/m  General:   Alert,  pleasant and cooperative in NAD Head:  Normocephalic and atraumatic. Neck:  Supple; no masses or thyromegaly. Lungs:  Clear throughout to auscultation.    Heart:  Regular rate and rhythm. Abdomen:  Soft, nontender and nondistended. Normal bowel sounds, without guarding, and without rebound.   Neurologic:  Alert and  oriented x4;  grossly normal neurologically.  Impression/Plan: Vicki Walters is now here to undergo a screening colonoscopy.  Risks, benefits, and alternatives regarding colonoscopy have been reviewed with the patient.  Questions have been answered.  All parties agreeable.

## 2023-02-11 NOTE — Anesthesia Postprocedure Evaluation (Signed)
Anesthesia Post Note  Patient: Kimyah Grieshop  Procedure(s) Performed: COLONOSCOPY WITH BIOPSY (Rectum) POLYPECTOMY (Rectum)  Patient location during evaluation: PACU Anesthesia Type: General Level of consciousness: awake and alert Pain management: pain level controlled Vital Signs Assessment: post-procedure vital signs reviewed and stable Respiratory status: spontaneous breathing, nonlabored ventilation and respiratory function stable Cardiovascular status: blood pressure returned to baseline and stable Postop Assessment: no apparent nausea or vomiting Anesthetic complications: no   No notable events documented.   Last Vitals:  Vitals:   02/11/23 0904 02/11/23 0908  BP: 101/72 105/77  Pulse: (!) 56 (!) 57  Resp: 14 12  Temp:    SpO2: 99% 100%    Last Pain:  Vitals:   02/11/23 0908  TempSrc:   PainSc: 0-No pain                 Foye Deer

## 2023-02-11 NOTE — Transfer of Care (Signed)
Immediate Anesthesia Transfer of Care Note  Patient: Vicki Walters  Procedure(s) Performed: COLONOSCOPY WITH BIOPSY (Rectum) POLYPECTOMY (Rectum)  Patient Location: PACU  Anesthesia Type: General  Level of Consciousness: awake, alert  and patient cooperative  Airway and Oxygen Therapy: Patient Spontanous Breathing and Patient connected to supplemental oxygen  Post-op Assessment: Post-op Vital signs reviewed, Patient's Cardiovascular Status Stable, Respiratory Function Stable, Patent Airway and No signs of Nausea or vomiting  Post-op Vital Signs: Reviewed and stable  Complications: No notable events documented.

## 2023-02-11 NOTE — Anesthesia Preprocedure Evaluation (Addendum)
Anesthesia Evaluation  Patient identified by MRN, date of birth, ID band Patient awake    Reviewed: Allergy & Precautions, H&P , NPO status , Patient's Chart, lab work & pertinent test results  Airway Mallampati: II  TM Distance: >3 FB Neck ROM: full    Dental no notable dental hx.    Pulmonary former smoker   Pulmonary exam normal        Cardiovascular negative cardio ROS Normal cardiovascular exam     Neuro/Psych negative neurological ROS  negative psych ROS   GI/Hepatic Neg liver ROS,GERD  ,,  Endo/Other  negative endocrine ROS    Renal/GU negative Renal ROS  negative genitourinary   Musculoskeletal   Abdominal   Peds  Hematology negative hematology ROS (+)   Anesthesia Other Findings Past Medical History: The past year: Anxiety     Comment:  Just sometimes 10/20/2020: Basal cell carcinoma     Comment:  BCC recurrent, EDC 12/05/2020 No date: Deaf 02/15/2022: Dysplastic nevus     Comment:  Right inf lat buttock, moderate atypia 02/15/2022: Dysplastic nevus     Comment:  Right upper back inf to medial scapula, mild atypia 02/15/2022: Dysplastic nevus     Comment:  Right sup medial scapula, moderate atypia 02/15/2022: Dysplastic nevus     Comment:  Left UQA, moderate atypia No date: GERD (gastroesophageal reflux disease) No date: Thyroid disease No date: Wears contact lenses   BMI    Body Mass Index: 22.96 kg/m      Reproductive/Obstetrics negative OB ROS                             Anesthesia Physical Anesthesia Plan  ASA: 2  Anesthesia Plan: General   Post-op Pain Management:    Induction: Intravenous  PONV Risk Score and Plan: Propofol infusion and TIVA  Airway Management Planned: Natural Airway  Additional Equipment:   Intra-op Plan:   Post-operative Plan:   Informed Consent: I have reviewed the patients History and Physical, chart, labs and discussed  the procedure including the risks, benefits and alternatives for the proposed anesthesia with the patient or authorized representative who has indicated his/her understanding and acceptance.     Dental Advisory Given  Plan Discussed with: CRNA and Surgeon  Anesthesia Plan Comments:         Anesthesia Quick Evaluation

## 2023-02-11 NOTE — Op Note (Signed)
[facility] Patient Name: Vicki Walters Procedure Date: 02/11/2023 8:32 AM MRN: [id number] Account #: [id number] Date of Birth: Jun 10, 1975 Admit Type: Outpatient Age: 48 Room: [facility] OR ROOM 01 Gender: Female Note Status: Finalized Instrument Name: [id number] Procedure:             Colonoscopy Indications:           Screening for colorectal malignant neoplasm Providers:             Midge Minium MD, MD Referring MD:          Lorre Munroe (Referring MD) Medicines:             Propofol per Anesthesia Complications:         No immediate complications. Procedure:             Pre-Anesthesia Assessment:                        - Prior to the procedure, a History and Physical was                         performed, and patient medications and allergies were                         reviewed. The patient's tolerance of previous                         anesthesia was also reviewed. The risks and benefits                         of the procedure and the sedation options and risks                         were discussed with the patient. All questions were                         answered, and informed consent was obtained. Prior                         Anticoagulants: The patient has taken no anticoagulant                         or antiplatelet agents. ASA Grade Assessment: II - A                         patient with mild systemic disease. After reviewing                         the risks and benefits, the patient was deemed in                         satisfactory condition to undergo the procedure.                        After obtaining informed consent, the colonoscope was                         passed under direct vision. Throughout the procedure,  the patient's blood pressure, pulse, and oxygen                         saturations were monitored continuously. The was                         introduced through the anus and advanced to  the the                         cecum, identified by appendiceal orifice and ileocecal                         valve. The colonoscopy was performed without                         difficulty. The patient tolerated the procedure well.                         The quality of the bowel preparation was excellent. Findings:      The perianal and digital rectal examinations were normal.      A 3 mm polyp was found in the transverse colon. The polyp was sessile.       The polyp was removed with a cold biopsy forceps. Resection and       retrieval were complete.      Non-bleeding internal hemorrhoids were found during retroflexion. The       hemorrhoids were Grade I (internal hemorrhoids that do not prolapse). Impression:            - One 3 mm polyp in the transverse colon, removed with                         a cold biopsy forceps. Resected and retrieved.                        - Non-bleeding internal hemorrhoids. Recommendation:        - Discharge patient to home.                        - Resume previous diet.                        - Continue present medications.                        - Await pathology results.                        - If the pathology report reveals adenomatous tissue,                         then repeat the colonoscopy for surveillance in 7                         years. Procedure Code(s):     --- Professional ---                        [phone], Colonoscopy, flexible; with biopsy, single or  multiple Diagnosis Code(s):     --- Professional ---                        Z12.11, Encounter for screening for malignant neoplasm                         of colon                        D12.3, Benign neoplasm of transverse colon (hepatic                         flexure or splenic flexure) CPT copyright 2022 American Medical Association. All rights reserved. The codes documented in this report are preliminary and upon coder review may  be revised to meet current  compliance requirements. Midge Minium MD, MD 02/11/2023 8:59:01 AM This report has been signed electronically. Number of Addenda: 0 Note Initiated On: 02/11/2023 8:32 AM Scope Withdrawal Time: 0 hours 7 minutes 26 seconds  Total Procedure Duration: 0 hours 11 minutes 13 seconds  Estimated Blood Loss:  Estimated blood loss: none.      [facility]

## 2023-02-12 ENCOUNTER — Encounter: Payer: Self-pay | Admitting: Gastroenterology

## 2023-02-12 LAB — SURGICAL PATHOLOGY

## 2023-02-13 ENCOUNTER — Encounter: Payer: Self-pay | Admitting: Gastroenterology

## 2023-02-16 IMAGING — MG MM DIGITAL SCREENING BILAT W/ TOMO AND CAD
8 series · 9 of 24 positions shown · non-contrast
Comparison: Previous exam(s).

CLINICAL DATA: Screening.

EXAM:
DIGITAL SCREENING BILATERAL MAMMOGRAM WITH TOMOSYNTHESIS AND CAD
TECHNIQUE: Bilateral screening digital craniocaudal and mediolateral oblique
mammograms were obtained. Bilateral screening digital breast
tomosynthesis was performed. The images were evaluated with
computer-aided detection.

[L CC synth-2D]
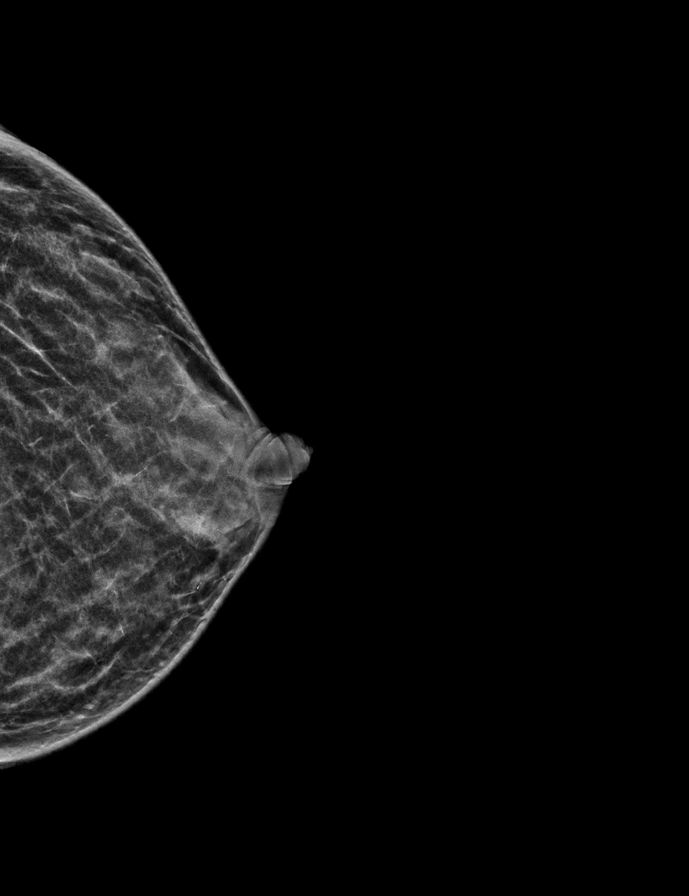

[R MLO synth-2D]
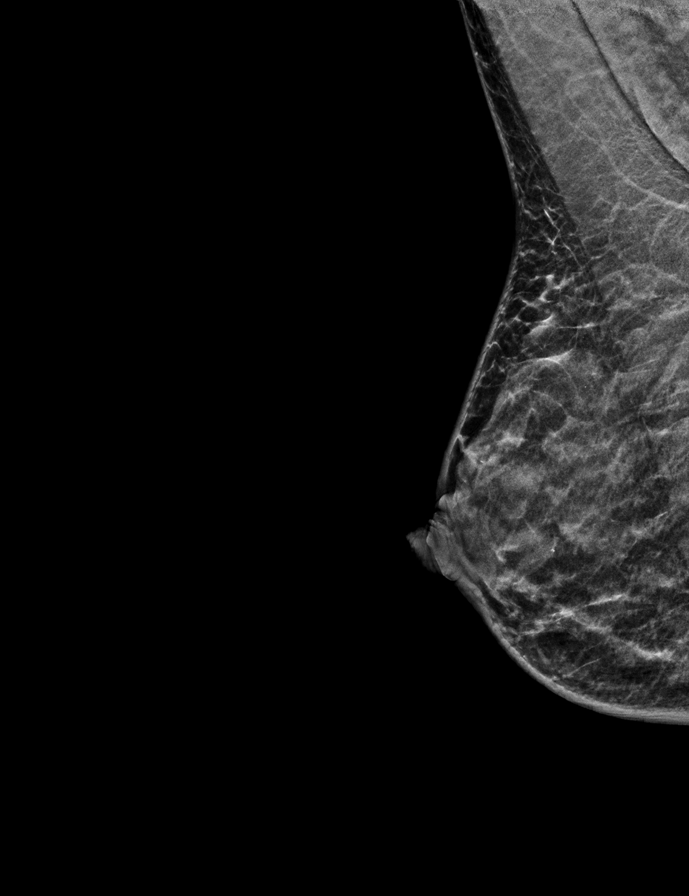

[R CC synth-2D]
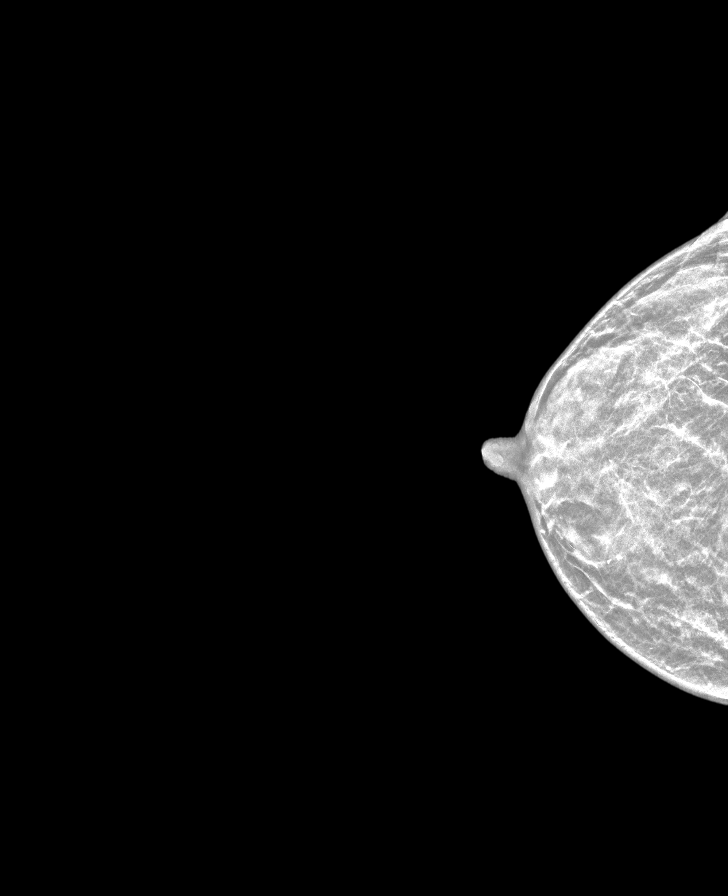

[L MLO synth-2D]
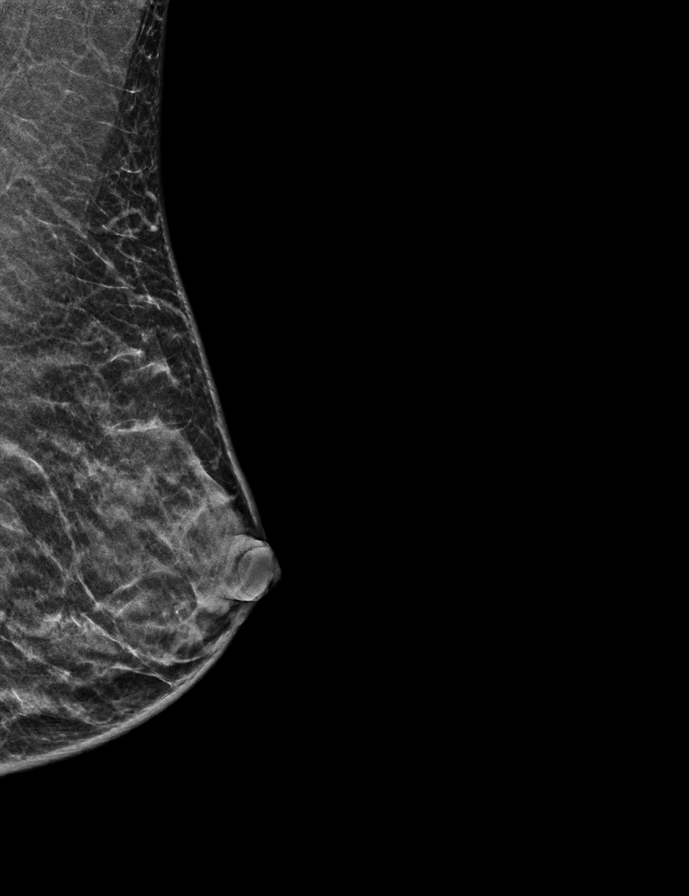

[L CC tomo · 2 of 33 frames shown]
[frame 11/33]
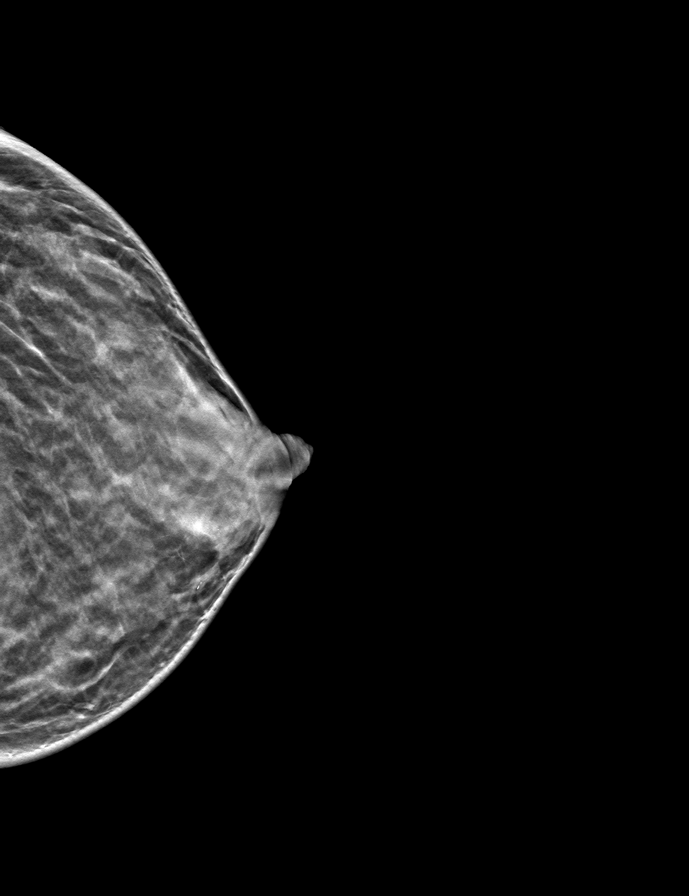
[frame 17/33]
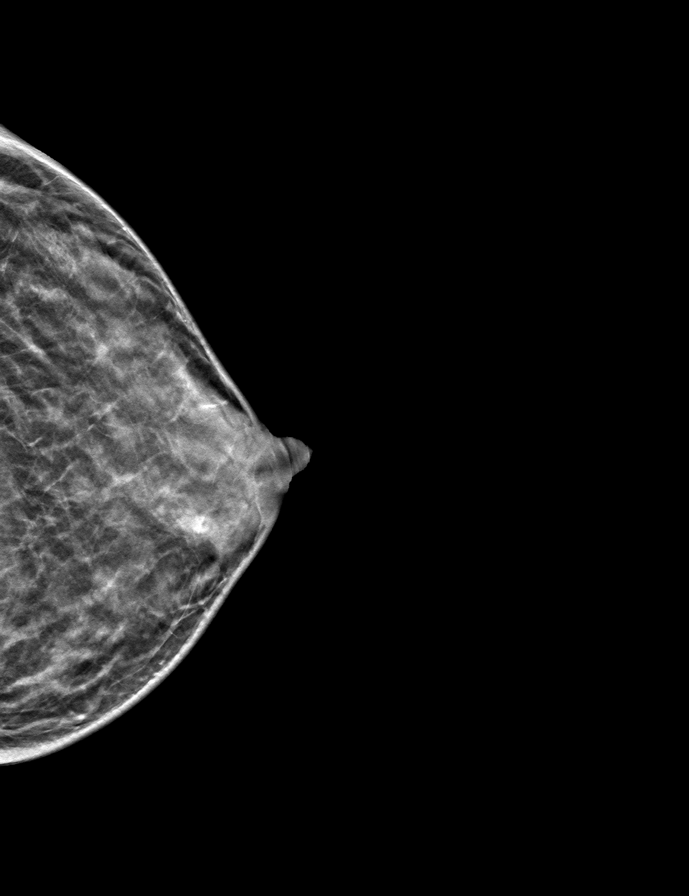

[R MLO tomo · tomo slice 18/35.0]
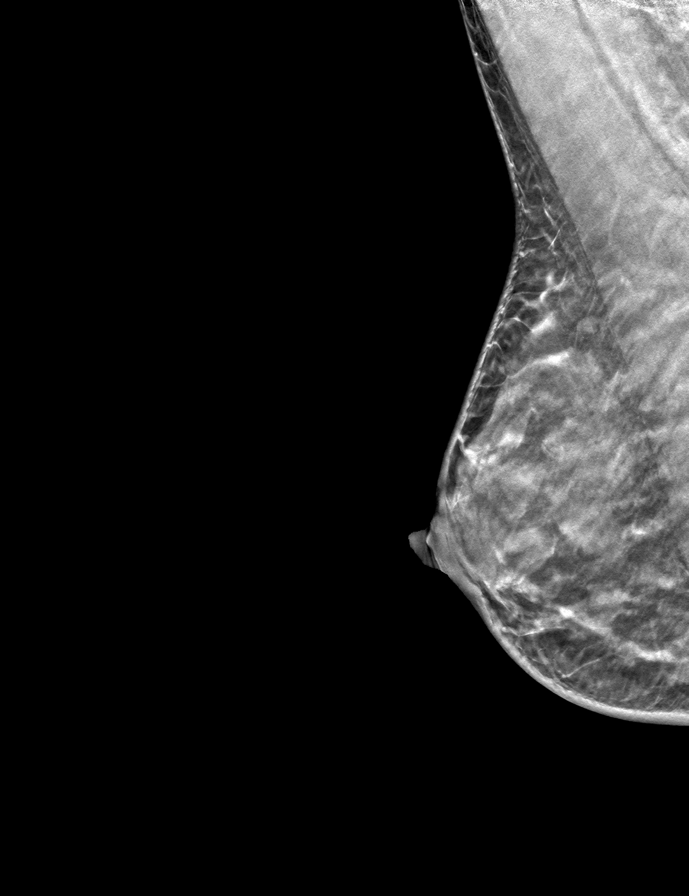

[R CC tomo · tomo slice 17/33.0]
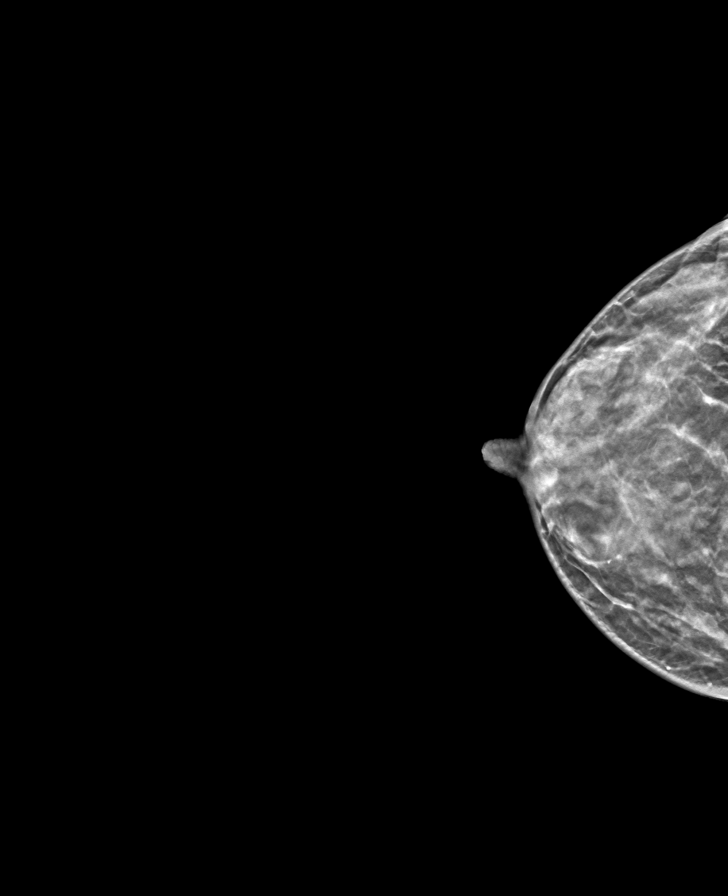

[L MLO tomo · tomo slice 16/31.0]
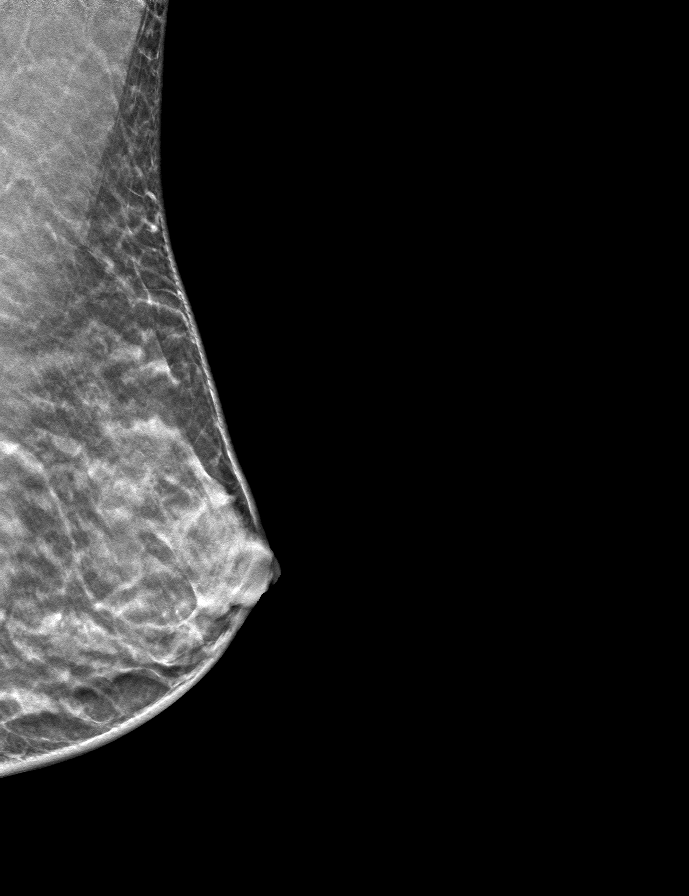

[9 of 24 positions shown; findings below may reference images not displayed]

ACR Breast Density Category c: The breast tissue is heterogeneously
dense, which may obscure small masses.
FINDINGS: There are no findings suspicious for malignancy.
IMPRESSION: No mammographic evidence of malignancy. A result letter of this
screening mammogram will be mailed directly to the patient.

RECOMMENDATION:
Screening mammogram in one year. (Code:Q3-W-BC3)

BI-RADS CATEGORY  1: Negative.

## 2023-02-20 ENCOUNTER — Ambulatory Visit: Payer: Self-pay | Admitting: Dermatology

## 2023-03-12 ENCOUNTER — Ambulatory Visit
Admission: RE | Admit: 2023-03-12 | Discharge: 2023-03-12 | Disposition: A | Discharge status: Home / Self Care | Payer: No Typology Code available for payment source | Source: Ambulatory Visit | Attending: Internal Medicine | Admitting: Internal Medicine

## 2023-03-12 DIAGNOSIS — Z1231 Encounter for screening mammogram for malignant neoplasm of breast: Secondary | ICD-10-CM | POA: Diagnosis present

## 2023-03-28 ENCOUNTER — Ambulatory Visit: Payer: Self-pay | Admitting: Dermatology

## 2023-04-25 ENCOUNTER — Encounter: Payer: Self-pay | Admitting: Internal Medicine

## 2023-06-13 ENCOUNTER — Ambulatory Visit (INDEPENDENT_AMBULATORY_CARE_PROVIDER_SITE_OTHER): Admitting: Dermatology

## 2023-06-13 ENCOUNTER — Encounter: Payer: Self-pay | Admitting: Dermatology

## 2023-06-13 DIAGNOSIS — Z86018 Personal history of other benign neoplasm: Secondary | ICD-10-CM

## 2023-06-13 DIAGNOSIS — L570 Actinic keratosis: Secondary | ICD-10-CM

## 2023-06-13 DIAGNOSIS — L578 Other skin changes due to chronic exposure to nonionizing radiation: Secondary | ICD-10-CM

## 2023-06-13 DIAGNOSIS — Z1283 Encounter for screening for malignant neoplasm of skin: Secondary | ICD-10-CM

## 2023-06-13 DIAGNOSIS — D2372 Other benign neoplasm of skin of left lower limb, including hip: Secondary | ICD-10-CM

## 2023-06-13 DIAGNOSIS — W908XXA Exposure to other nonionizing radiation, initial encounter: Secondary | ICD-10-CM | POA: Diagnosis not present

## 2023-06-13 DIAGNOSIS — D229 Melanocytic nevi, unspecified: Secondary | ICD-10-CM

## 2023-06-13 DIAGNOSIS — D1801 Hemangioma of skin and subcutaneous tissue: Secondary | ICD-10-CM

## 2023-06-13 DIAGNOSIS — D239 Other benign neoplasm of skin, unspecified: Secondary | ICD-10-CM

## 2023-06-13 DIAGNOSIS — Z85828 Personal history of other malignant neoplasm of skin: Secondary | ICD-10-CM

## 2023-06-13 DIAGNOSIS — D2371 Other benign neoplasm of skin of right lower limb, including hip: Secondary | ICD-10-CM

## 2023-06-13 DIAGNOSIS — L821 Other seborrheic keratosis: Secondary | ICD-10-CM

## 2023-06-13 DIAGNOSIS — L905 Scar conditions and fibrosis of skin: Secondary | ICD-10-CM

## 2023-06-13 DIAGNOSIS — L814 Other melanin hyperpigmentation: Secondary | ICD-10-CM

## 2023-06-13 NOTE — Progress Notes (Signed)
 Follow-Up Visit   Subjective  Vicki Walters is a 48 y.o. female who presents for the following: Skin Cancer Screening and Full Body Skin Exam  The patient presents for Total-Body Skin Exam (TBSE) for skin cancer screening and mole check. The patient has spots, moles and lesions to be evaluated, some may be new or changing and the patient may have concern these could be cancer.  The following portions of the chart were reviewed this encounter and updated as appropriate: medications, allergies, medical history  Review of Systems:  No other skin or systemic complaints except as noted in HPI or Assessment and Plan.  Objective  Well appearing patient in no apparent distress; mood and affect are within normal limits.  A full examination was performed including scalp, head, eyes, ears, nose, lips, neck, chest, axillae, abdomen, back, buttocks, bilateral upper extremities, bilateral lower extremities, hands, feet, fingers, toes, fingernails, and toenails. All findings within normal limits unless otherwise noted below.   Relevant physical exam findings are noted in the Assessment and Plan.  R forehead x 1 Erythematous thin papules/macules with gritty scale.    Assessment & Plan   SKIN CANCER SCREENING PERFORMED TODAY.  ACTINIC DAMAGE - Chronic condition, secondary to cumulative UV/sun exposure - diffuse scaly erythematous macules with underlying dyspigmentation - Recommend daily broad spectrum sunscreen SPF 30+ to sun-exposed areas, reapply every 2 hours as needed.  - Staying in the shade or wearing long sleeves, sun glasses (UVA+UVB protection) and wide brim hats (4-inch brim around the entire circumference of the hat) are also recommended for sun protection.  - Call for new or changing lesions.  LENTIGINES, SEBORRHEIC KERATOSES, HEMANGIOMAS - Benign normal skin lesions - Benign-appearing - Call for any changes  MELANOCYTIC NEVI - Tan-brown and/or pink-flesh-colored symmetric  macules and papules - Benign appearing on exam today - Observation - Call clinic for new or changing moles - Recommend daily use of broad spectrum spf 30+ sunscreen to sun-exposed areas.   HISTORY OF BASAL CELL CARCINOMA OF THE SKIN - L chest - No evidence of recurrence today - Recommend regular full body skin exams - Recommend daily broad spectrum sunscreen SPF 30+ to sun-exposed areas, reapply every 2 hours as needed.  - Call if any new or changing lesions are noted between office visits  HISTORY OF DYSPLASTIC NEVI No evidence of recurrence today Recommend regular full body skin exams Recommend daily broad spectrum sunscreen SPF 30+ to sun-exposed areas, reapply every 2 hours as needed.  Call if any new or changing lesions are noted between office visits   AK (ACTINIC KERATOSIS) R forehead x 1 Actinic keratoses are precancerous spots that appear secondary to cumulative UV radiation exposure/sun exposure over time. They are chronic with expected duration over 1 year. A portion of actinic keratoses will progress to squamous cell carcinoma of the skin. It is not possible to reliably predict which spots will progress to skin cancer and so treatment is recommended to prevent development of skin cancer.  Recommend daily broad spectrum sunscreen SPF 30+ to sun-exposed areas, reapply every 2 hours as needed.  Recommend staying in the shade or wearing long sleeves, sun glasses (UVA+UVB protection) and wide brim hats (4-inch brim around the entire circumference of the hat). Call for new or changing lesions.  Destruction of lesion - R forehead x 1 Complexity: simple   Destruction method: cryotherapy   Informed consent: discussed and consent obtained   Timeout:  patient name, date of birth, surgical site, and procedure  verified Lesion destroyed using liquid nitrogen: Yes   Region frozen until ice ball extended beyond lesion: Yes   Outcome: patient tolerated procedure well with no  complications   Post-procedure details: wound care instructions given    SCAR Exam: Dyspigmented smooth macule or patch R clavicle Benign-appearing.  Observation.  Call clinic for new or changing lesions. Recommend daily broad spectrum sunscreen SPF 30+, reapply every 2 hours as needed. Treatment: Recommend Serica moisturizing scar formula cream every night or [employer] brand or Mederma silicone scar sheet every night for the first year after a scar appears to help with scar remodeling if desired. Scars remodel on their own for a full year and will gradually improve in appearance over time.  DERMATOFIBROMA Exam: Firm pink/brown papulenodule with dimple sign L distal post med thigh, R med knee, R med calf Treatment Plan: A dermatofibroma is a benign growth possibly related to trauma, such as an insect bite, cut from shaving, or inflamed acne-type bump.  Treatment options to remove include shave or excision with resulting scar and risk of recurrence.  Since benign-appearing and not bothersome, will observe for now.   Return in about 1 year (around 06/12/2024) for TBSE - hx BCC, dysplastic nevi.  Arlinda Lais, CMA, am acting as scribe for Celine Collard, MD .   Documentation: I have reviewed the above documentation for accuracy and completeness, and I agree with the above.  Celine Collard, MD

## 2023-06-13 NOTE — Patient Instructions (Signed)

## 2023-06-18 ENCOUNTER — Other Ambulatory Visit: Payer: Self-pay | Admitting: Otolaryngology

## 2023-06-18 DIAGNOSIS — H9190 Unspecified hearing loss, unspecified ear: Secondary | ICD-10-CM

## 2023-06-18 DIAGNOSIS — H903 Sensorineural hearing loss, bilateral: Secondary | ICD-10-CM

## 2023-07-02 ENCOUNTER — Ambulatory Visit
Admission: RE | Admit: 2023-07-02 | Discharge: 2023-07-02 | Disposition: A | Discharge status: Home / Self Care | Source: Ambulatory Visit | Attending: Otolaryngology | Admitting: Otolaryngology

## 2023-07-02 DIAGNOSIS — H903 Sensorineural hearing loss, bilateral: Secondary | ICD-10-CM | POA: Insufficient documentation

## 2023-07-02 DIAGNOSIS — H9190 Unspecified hearing loss, unspecified ear: Secondary | ICD-10-CM | POA: Diagnosis not present

## 2023-07-02 MED ORDER — GADOBUTROL 1 MMOL/ML IV SOLN
6.0000 mL | Freq: Once | INTRAVENOUS | Status: AC | PRN
  Administered 2023-07-02: 6 mL via INTRAVENOUS

## 2023-07-20 ENCOUNTER — Other Ambulatory Visit: Payer: Self-pay | Admitting: Internal Medicine

## 2023-07-22 NOTE — Telephone Encounter (Signed)
 OV 01/03/23 Requested Prescriptions  Pending Prescriptions Disp Refills   atorvastatin  (LIPITOR) 10 MG tablet [Pharmacy Med Name: ATORVASTATIN  10 MG TABLET] 90 tablet 1    Sig: TAKE 1 TABLET BY MOUTH EVERY DAY     Cardiovascular:  Antilipid - Statins Failed - 07/22/2023  4:24 PM      Failed - Valid encounter within last 12 months    Recent Outpatient Visits   None     Future Appointments             In 11 months Hester Alm BROCKS, MD [facility]            Failed - Lipid Panel in normal range within the last 12 months    Cholesterol, Total  Date Value Ref Range Status  03/11/2015 240 (H) 100 - 199 mg/dL Final   Cholesterol  Date Value Ref Range Status  01/03/2023 248 (H) <200 mg/dL Final   LDL Cholesterol (Calc)  Date Value Ref Range Status  01/03/2023 142 (H) mg/dL (calc) Final    Comment:    Reference range: <100 . Desirable range <100 mg/dL for primary prevention;   <70 mg/dL for patients with CHD or diabetic patients  with > or = 2 CHD risk factors. SABRA LDL-C is now calculated using the Martin-Hopkins  calculation, which is a validated novel method providing  better accuracy than the Friedewald equation in the  estimation of LDL-C.  Gladis APPLETHWAITE et al. SANDREA. 7986;689(80): 2061-2068  (http://education.[facility].com/faq/FAQ164)    HDL  Date Value Ref Range Status  01/03/2023 67 > OR = 50 mg/dL Final  97/82/7982 70 >60 mg/dL Final   Triglycerides  Date Value Ref Range Status  01/03/2023 244 (H) <150 mg/dL Final    Comment:    . If a non-fasting specimen was collected, consider repeat triglyceride testing on a fasting specimen if clinically indicated.  Veatrice et al. J. of Clin. Lipidol. 2015;9:129-169. SABRA          Passed - Patient is not pregnant

## 2023-07-25 ENCOUNTER — Other Ambulatory Visit

## 2023-07-25 DIAGNOSIS — E782 Mixed hyperlipidemia: Secondary | ICD-10-CM

## 2023-07-25 LAB — LIPID PANEL
Cholesterol: 198 mg/dL (ref <200)
HDL: 78 mg/dL (ref >=50)
LDL Cholesterol (Calc): 99 mg/dL
Non-HDL Cholesterol (Calc): 120 mg/dL (ref <130)
Total CHOL/HDL Ratio: 2.5 (calc) (ref <5.0)
Triglycerides: 118 mg/dL (ref <150)

## 2023-07-25 LAB — COMPLETE METABOLIC PANEL WITHOUT GFR
AG Ratio: 1.8 (calc) (ref 1.0–2.5)
ALT: 19 U/L (ref 6–29)
AST: 22 U/L (ref 10–35)
Albumin: 4.5 g/dL (ref 3.6–5.1)
Alkaline phosphatase (APISO): 60 U/L (ref 31–125)
BUN: 22 mg/dL (ref 7–25)
CO2: 27 mmol/L (ref 20–32)
Calcium: 9.2 mg/dL (ref 8.6–10.2)
Chloride: 104 mmol/L (ref 98–110)
Creat: 0.8 mg/dL (ref 0.50–0.99)
Globulin: 2.5 g/dL (ref 1.9–3.7)
Glucose, Bld: 92 mg/dL (ref 65–99)
Potassium: 4 mmol/L (ref 3.5–5.3)
Sodium: 139 mmol/L (ref 135–146)
Total Bilirubin: 0.8 mg/dL (ref 0.2–1.2)
Total Protein: 7 g/dL (ref 6.1–8.1)

## 2023-07-27 ENCOUNTER — Ambulatory Visit: Payer: Self-pay | Admitting: Internal Medicine

## 2023-07-29 NOTE — Patient Instructions (Signed)
 Preventive Care 16-46 Years Old, Female  Preventive care refers to lifestyle choices and visits with your health care provider that can promote health and wellness. Preventive care visits are also called wellness exams.  What can I expect for my preventive care visit?  Counseling  Your health care provider may ask you questions about your:  Medical history, including:  Past medical problems.  Family medical history.  Pregnancy history.  Current health, including:  Menstrual cycle.  Method of birth control.  Emotional well-being.  Home life and relationship well-being.  Sexual activity and sexual health.  Lifestyle, including:  Alcohol, nicotine or tobacco, and drug use.  Access to firearms.  Diet, exercise, and sleep habits.  Work and work Astronomer.  Sunscreen use.  Safety issues such as seatbelt and bike helmet use.  Physical exam  Your health care provider will check your:  Height and weight. These may be used to calculate your BMI (body mass index). BMI is a measurement that tells if you are at a healthy weight.  Waist circumference. This measures the distance around your waistline. This measurement also tells if you are at a healthy weight and may help predict your risk of certain diseases, such as type 2 diabetes and high blood pressure.  Heart rate and blood pressure.  Body temperature.  Skin for abnormal spots.  What immunizations do I need?    Vaccines are usually given at various ages, according to a schedule. Your health care provider will recommend vaccines for you based on your age, medical history, and lifestyle or other factors, such as travel or where you work.  What tests do I need?  Screening  Your health care provider may recommend screening tests for certain conditions. This may include:  Lipid and cholesterol levels.  Diabetes screening. This is done by checking your blood sugar (glucose) after you have not eaten for a while (fasting).  Pelvic exam and Pap test.  Hepatitis B test.  Hepatitis C  test.  HIV (human immunodeficiency virus) test.  STI (sexually transmitted infection) testing, if you are at risk.  Lung cancer screening.  Colorectal cancer screening.  Mammogram. Talk with your health care provider about when you should start having regular mammograms. This may depend on whether you have a family history of breast cancer.  BRCA-related cancer screening. This may be done if you have a family history of breast, ovarian, tubal, or peritoneal cancers.  Bone density scan. This is done to screen for osteoporosis.  Talk with your health care provider about your test results, treatment options, and if necessary, the need for more tests.  Follow these instructions at home:  Eating and drinking    Eat a diet that includes fresh fruits and vegetables, whole grains, lean protein, and low-fat dairy products.  Take vitamin and mineral supplements as recommended by your health care provider.  Do not drink alcohol if:  Your health care provider tells you not to drink.  You are pregnant, may be pregnant, or are planning to become pregnant.  If you drink alcohol:  Limit how much you have to 0-1 drink a day.  Know how much alcohol is in your drink. In the [place] one drink equals one 12 oz bottle of beer (355 mL), one 5 oz glass of wine (148 mL), or one 1 oz glass of hard liquor (44 mL).  Lifestyle  Brush your teeth every morning and night with fluoride toothpaste. Floss one time each day.  Exercise for at least  30 minutes 5 or more days each week.  Do not use any products that contain nicotine or tobacco. These products include cigarettes, chewing tobacco, and vaping devices, such as e-cigarettes. If you need help quitting, ask your health care provider.  Do not use drugs.  If you are sexually active, practice safe sex. Use a condom or other form of protection to prevent STIs.  If you do not wish to become pregnant, use a form of birth control. If you plan to become pregnant, see your health care provider for a  prepregnancy visit.  Take aspirin only as told by your health care provider. Make sure that you understand how much to take and what form to take. Work with your health care provider to find out whether it is safe and beneficial for you to take aspirin daily.  Find healthy ways to manage stress, such as:  Meditation, yoga, or listening to music.  Journaling.  Talking to a trusted person.  Spending time with friends and family.  Minimize exposure to UV radiation to reduce your risk of skin cancer.  Safety  Always wear your seat belt while driving or riding in a vehicle.  Do not drive:  If you have been drinking alcohol. Do not ride with someone who has been drinking.  When you are tired or distracted.  While texting.  If you have been using any mind-altering substances or drugs.  Wear a helmet and other protective equipment during sports activities.  If you have firearms in your house, make sure you follow all gun safety procedures.  Seek help if you have been physically or sexually abused.  What's next?  Visit your health care provider once a year for an annual wellness visit.  Ask your health care provider how often you should have your eyes and teeth checked.  Stay up to date on all vaccines.  This information is not intended to replace advice given to you by your health care provider. Make sure you discuss any questions you have with your health care provider.  Document Revised: 07/06/2020 Document Reviewed: 07/06/2020  Elsevier Patient Education  2024 [employer].

## 2023-07-30 NOTE — Progress Notes (Unsigned)
 ANNUAL PREVENTATIVE CARE GYNECOLOGY  ENCOUNTER NOTE  SUBJECTIVE:       Mahasin Riviere is a 48 y.o. G56P0010 female here for a routine annual gynecologic exam. The patient {is/is not/has never been:13135} sexually active. The patient {is/is not:13135} taking hormone replacement therapy. {post-men bleed:13152::Patient denies post-menopausal vaginal bleeding.} Family history of breast, uterine, ovarian cancer: {yes/no:311178}. The patient wears seatbelts: {yes/no:311178}. The patient participates in regular exercise: {yes/no/not asked:9010}. Has the patient ever been transfused or tattooed?: {yes/no/not asked:9010}. The patient reports that there {is/is not:9024} domestic violence in her life. Has the patient completed the Gardasil vaccine? {yes/no:311178}.  Current complaints: 1.  ***    Gynecologic History No LMP recorded. (Menstrual status: Perimenopausal). Contraception: {method:5051} Last Pap: 10/01/18. Results were: normal History of abnormal pap: *** History of STIs: *** Last Mammogram: 03/20/23. Results were: normal Last Colonoscopy: 02/11/23 Last Dexa Scan: n/a  PHQ-2:     01/03/2023    2:37 PM 10/26/2020    9:45 AM  Depression screen PHQ 2/9  Decreased Interest 0 0  Down, Depressed, Hopeless 0 0  PHQ - 2 Score 0 0  Altered sleeping  0  Tired, decreased energy  0  Change in appetite  0  Feeling bad or failure about yourself   0  Trouble concentrating  0  Moving slowly or fidgety/restless  0  Suicidal thoughts  0  PHQ-9 Score  0  Difficult doing work/chores  Not difficult at all    Obstetric History OB History  Gravida Para Term Preterm AB Living  2    1   SAB IAB Ectopic Multiple Live Births          # Outcome Date GA Lbr Len/2nd Weight Sex Type Anes PTL Lv  2 Gravida           1 AB 2009            Past Medical History:  Diagnosis Date   Anxiety The past year   Just sometimes   Basal cell carcinoma 10/20/2020   BCC recurrent, EDC 12/05/2020   Basal  cell carcinoma    R clavicle - not tx at [facility]   Deaf    Dysplastic nevus 02/15/2022   Right inf lat buttock, moderate atypia   Dysplastic nevus 02/15/2022   Right upper back inf to medial scapula, mild atypia   Dysplastic nevus 02/15/2022   Right sup medial scapula, moderate atypia   Dysplastic nevus 02/15/2022   Left UQA, moderate atypia   GERD (gastroesophageal reflux disease)    Thyroid  disease    Wears contact lenses     Family History  Problem Relation Age of Onset   Hearing loss Mother    Heart disease Father    Breast cancer Sister 42   Cancer Sister    Hearing loss Sister    Diabetes Paternal Aunt    Heart disease Maternal Grandmother    Heart attack Maternal Grandmother    Cancer Maternal Grandfather    Hearing loss Maternal Grandfather    Diabetes Paternal Grandfather     Past Surgical History:  Procedure Laterality Date   COLONOSCOPY WITH PROPOFOL  N/A 02/11/2023   Procedure: COLONOSCOPY WITH BIOPSY;  Surgeon: Jinny Carmine, MD;  Location: [facility];  Service: Endoscopy;  Laterality: N/A;   POLYPECTOMY N/A 02/11/2023   Procedure: POLYPECTOMY;  Surgeon: Jinny Carmine, MD;  Location: [facility];  Service: Endoscopy;  Laterality: N/A;    Social History   Socioeconomic History   Marital  status: Married    Spouse name: Not on file   Number of children: Not on file   Years of education: Not on file   Highest education level: Some college, no degree  Occupational History   Not on file  Tobacco Use   Smoking status: Former    Current packs/day: 0.00    Average packs/day: 0.5 packs/day for 10.0 years (5.0 ttl pk-yrs)    Types: Cigarettes    Start date: 03/09/1997    Quit date: 03/10/2007    Years since quitting: 16.4   Smokeless tobacco: Never   Tobacco comments:    I quit 15 years ago.  Vaping Use   Vaping status: Never Used  Substance and Sexual Activity   Alcohol use: Yes    Alcohol/week: 3.0 standard drinks of alcohol    Types: 3  Glasses of wine per week    Comment: I have wine on the weekends only.   Drug use: Never   Sexual activity: Yes    Birth control/protection: None  Other Topics Concern   Not on file  Social History Narrative   Not on file   Social Drivers of Health   Financial Resource Strain: Patient Declined (01/02/2023)   Overall Financial Resource Strain (CARDIA)    Difficulty of Paying Living Expenses: Patient declined  Food Insecurity: Patient Declined (01/02/2023)   Hunger Vital Sign    Worried About Running Out of Food in the Last Year: Patient declined    Ran Out of Food in the Last Year: Patient declined  Transportation Needs: No Transportation Needs (01/02/2023)   PRAPARE - Administrator, Civil Service (Medical): No    Lack of Transportation (Non-Medical): No  Physical Activity: Sufficiently Active (01/02/2023)   Exercise Vital Sign    Days of Exercise per Week: 4 days    Minutes of Exercise per Session: 150+ min  Stress: No Stress Concern Present (01/02/2023)   [employer] of Occupational Health - Occupational Stress Questionnaire    Feeling of Stress : Only a little  Social Connections: Moderately Integrated (01/02/2023)   Social Connection and Isolation Panel    Frequency of Communication with Friends and Family: More than three times a week    Frequency of Social Gatherings with Friends and Family: Once a week    Attends Religious Services: 1 to 4 times per year    Active Member of [employer] or Organizations: No    Attends Engineer, structural: Not on file    Marital Status: Married  Intimate Partner Violence: Patient Declined (01/03/2023)   Humiliation, Afraid, Rape, and Kick questionnaire    Fear of Current or Ex-Partner: Patient declined    Emotionally Abused: Patient declined    Physically Abused: Patient declined    Sexually Abused: Patient declined    Current Outpatient Medications on File Prior to Visit  Medication Sig Dispense Refill    atorvastatin  (LIPITOR) 10 MG tablet TAKE 1 TABLET BY MOUTH EVERY DAY 90 tablet 1   Multiple Vitamin (MULTIVITAMIN) capsule Take 1 capsule by mouth daily.     No current facility-administered medications on file prior to visit.    No Known Allergies   Review of Systems ROS Review of Systems - General ROS: negative for - chills, fatigue, fever, hot flashes, night sweats, weight gain or weight loss Psychological ROS: negative for - anxiety, decreased libido, depression, mood swings, physical abuse or sexual abuse Ophthalmic ROS: negative for - blurry vision, eye pain or loss of  vision ENT ROS: negative for - headaches, hearing change, visual changes or vocal changes Allergy and Immunology ROS: negative for - hives, itchy/watery eyes or seasonal allergies Hematological and Lymphatic ROS: negative for - bleeding problems, bruising, swollen lymph nodes or weight loss Endocrine ROS: negative for - galactorrhea, hair pattern changes, hot flashes, malaise/lethargy, mood swings, palpitations, polydipsia/polyuria, skin changes, temperature intolerance or unexpected weight changes Breast ROS: negative for - new or changing breast lumps or nipple discharge Respiratory ROS: negative for - cough or shortness of breath Cardiovascular ROS: negative for - chest pain, irregular heartbeat, palpitations or shortness of breath Gastrointestinal ROS: no abdominal pain, change in bowel habits, or black or bloody stools Genito-Urinary ROS: no dysuria, trouble voiding, or hematuria Musculoskeletal ROS: negative for - joint pain or joint stiffness Neurological ROS: negative for - bowel and bladder control changes Dermatological ROS: negative for rash and skin lesion changes   OBJECTIVE:   There were no vitals taken for this visit.  CONSTITUTIONAL: Well-developed, well-nourished female in no acute distress.  PSYCHIATRIC: Normal mood and affect. Normal behavior. Normal judgment and thought content. NEUROLGIC:  Alert and oriented to person, place, and time. Normal muscle tone coordination. No cranial nerve deficit noted. HENT:  Normocephalic, atraumatic, External right and left ear normal. Oropharynx is clear and moist EYES: Conjunctivae and EOM are normal. No scleral icterus.  NECK: Normal range of motion, supple, no masses.  Normal thyroid .  SKIN: Skin is warm and dry. No rash noted. Not diaphoretic. No erythema. No pallor. CARDIOVASCULAR: Normal heart rate noted, regular rhythm, no murmur. RESPIRATORY: Clear to auscultation bilaterally. Effort and breath sounds normal, no problems with respiration noted. BREASTS: Symmetric in size. No masses, skin changes, nipple drainage, or lymphadenopathy. ABDOMEN: Soft, normal bowel sounds, no distention noted.  No tenderness, rebound or guarding.  PELVIC:  Bladder {:311640}  Urethra: {:311719}  Vulva: {:311722}  Vagina: {:311643}  Cervix: {:311644}  Uterus: {:311718}  Adnexa: {:311645}  RV: {Blank multiple:19196::External Exam NormaI,No Rectal Masses,Normal Sphincter tone}  MUSCULOSKELETAL: Normal range of motion. No tenderness.  No cyanosis, clubbing, or edema.  2+ distal pulses. LYMPHATIC: No Axillary, Supraclavicular, or Inguinal Adenopathy.  Labs: Lab Results  Component Value Date   WBC 7.2 01/03/2023   HGB 14.9 01/03/2023   HCT 43.8 01/03/2023   MCV 94.4 01/03/2023   PLT 324 01/03/2023    Lab Results  Component Value Date   CREATININE 0.80 07/25/2023   BUN 22 07/25/2023   NA 139 07/25/2023   K 4.0 07/25/2023   CL 104 07/25/2023   CO2 27 07/25/2023    Lab Results  Component Value Date   ALT 19 07/25/2023   AST 22 07/25/2023   ALKPHOS 61 03/11/2015   BILITOT 0.8 07/25/2023    Lab Results  Component Value Date   CHOL 198 07/25/2023   HDL 78 07/25/2023   LDLCALC 99 07/25/2023   TRIG 118 07/25/2023   CHOLHDL 2.5 07/25/2023    Lab Results  Component Value Date   TSH 0.57 04/21/2020    Lab Results  Component Value  Date   HGBA1C 5.6 01/03/2023     ASSESSMENT:   1. Well woman exam with routine gynecological exam   2. Cervical cancer screening      PLAN:   Oluwatobi Ruppe is a 48 y.o. G45P0010 female here today for her annual exam, doing well.  Pap: done with cotesting today Mammogram: due 03/19/2024 Colon: PCP ordered colonoscopy done 02/11/23  Labs: ***A1C, CMP, HepC, Lipid panel,  Vit D, TSH PHQ-2 = ***, discussed coping techniques; RTC if worsens or develops concern Contraception: *** Healthy lifestyle modifications discussed: multivitamin, diet, exercise, sunscreen, tobacco and alcohol use. Emphasized importance of regular physical activity.  Calcium  and Vit D recommendation reviewed.  All questions answered to patient's satisfaction.   Follow up 1 yr for annual, sooner prn.    Estil Mangle, DO [facility] at [facility]

## 2023-07-31 ENCOUNTER — Ambulatory Visit (INDEPENDENT_AMBULATORY_CARE_PROVIDER_SITE_OTHER): Admitting: Obstetrics

## 2023-07-31 ENCOUNTER — Encounter: Payer: Self-pay | Admitting: Obstetrics

## 2023-07-31 ENCOUNTER — Other Ambulatory Visit (HOSPITAL_COMMUNITY)
Admission: RE | Admit: 2023-07-31 | Discharge: 2023-07-31 | Disposition: A | Discharge status: Home / Self Care | Source: Ambulatory Visit | Attending: Obstetrics | Admitting: Obstetrics

## 2023-07-31 VITALS — BP 124/85 | HR 60 | Ht 65.0 in | Wt 144.9 lb

## 2023-07-31 DIAGNOSIS — Z1272 Encounter for screening for malignant neoplasm of vagina: Secondary | ICD-10-CM | POA: Diagnosis not present

## 2023-07-31 DIAGNOSIS — Z124 Encounter for screening for malignant neoplasm of cervix: Secondary | ICD-10-CM

## 2023-07-31 DIAGNOSIS — Z1331 Encounter for screening for depression: Secondary | ICD-10-CM

## 2023-07-31 DIAGNOSIS — Z01419 Encounter for gynecological examination (general) (routine) without abnormal findings: Secondary | ICD-10-CM | POA: Insufficient documentation

## 2023-08-06 ENCOUNTER — Ambulatory Visit: Payer: Self-pay | Admitting: Obstetrics

## 2023-08-06 LAB — CYTOLOGY - PAP
Comment: NEGATIVE
Diagnosis: NEGATIVE
High risk HPV: NEGATIVE

## 2023-09-09 ENCOUNTER — Encounter: Payer: Self-pay | Admitting: Internal Medicine

## 2023-09-11 ENCOUNTER — Telehealth: Payer: Self-pay

## 2023-09-11 NOTE — Telephone Encounter (Signed)
 Copied from CRM [id number]. Topic: Appointments - Scheduling Inquiry for Clinic >> Sep 11, 2023 11:03 AM Treva T wrote: Reason for CRM: Received call from patient, sattes she is having surgery next month for a cochlear implant, and is requesting a vaccine appointment.  Vaccine for Pneumococcal (Pneumovax), specifically per patient, PCV20.  Per [facility], sending CRM for schediling.  Patient can be reached at [phone] to be scheduled for appointment.

## 2023-09-17 ENCOUNTER — Ambulatory Visit (INDEPENDENT_AMBULATORY_CARE_PROVIDER_SITE_OTHER)

## 2023-09-17 DIAGNOSIS — Z23 Encounter for immunization: Secondary | ICD-10-CM | POA: Diagnosis not present

## 2023-11-01 ENCOUNTER — Encounter: Payer: Self-pay | Admitting: Internal Medicine

## 2023-11-01 MED ORDER — ATORVASTATIN CALCIUM 10 MG PO TABS: 10.0000 mg | ORAL_TABLET | Freq: Every day | ORAL | 1 refills | Status: AC

## 2023-11-25 ENCOUNTER — Encounter: Payer: Self-pay | Admitting: Internal Medicine

## 2024-02-21 ENCOUNTER — Other Ambulatory Visit: Payer: Self-pay | Admitting: Internal Medicine

## 2024-02-21 DIAGNOSIS — Z1231 Encounter for screening mammogram for malignant neoplasm of breast: Secondary | ICD-10-CM

## 2024-03-18 ENCOUNTER — Encounter

## 2024-06-18 ENCOUNTER — Ambulatory Visit: Admitting: Dermatology
